# Patient Record
Sex: Female | Born: 1955 | Race: Black or African American | Hispanic: No | Marital: Single | Smoking: Former smoker
Health system: Southern US, Community
[De-identification: ages and names within clinical notes are randomized; demographics above are authoritative.]

## PROBLEM LIST (undated history)

## (undated) ENCOUNTER — Ambulatory Visit: Admission: EM

## (undated) ENCOUNTER — Emergency Department (HOSPITAL_COMMUNITY): Admission: EM | Discharge status: Absent without leave | Payer: No Typology Code available for payment source

## (undated) DIAGNOSIS — K219 Gastro-esophageal reflux disease without esophagitis: Secondary | ICD-10-CM

## (undated) DIAGNOSIS — E785 Hyperlipidemia, unspecified: Secondary | ICD-10-CM

---

## 1999-04-20 ENCOUNTER — Emergency Department (HOSPITAL_COMMUNITY): Admission: EM | Admit: 1999-04-20 | Discharge: 1999-04-20 | Payer: Self-pay | Admitting: Emergency Medicine

## 1999-04-20 ENCOUNTER — Encounter: Payer: Self-pay | Admitting: Emergency Medicine

## 1999-05-17 ENCOUNTER — Encounter (INDEPENDENT_AMBULATORY_CARE_PROVIDER_SITE_OTHER): Payer: Self-pay | Admitting: *Deleted

## 1999-05-17 LAB — CONVERTED CEMR LAB

## 1999-05-22 ENCOUNTER — Other Ambulatory Visit: Admission: RE | Admit: 1999-05-22 | Discharge: 1999-05-22 | Payer: Self-pay | Admitting: *Deleted

## 1999-05-22 ENCOUNTER — Encounter: Admission: RE | Admit: 1999-05-22 | Discharge: 1999-05-22 | Payer: Self-pay | Admitting: Family Medicine

## 1999-06-15 ENCOUNTER — Encounter: Admission: RE | Admit: 1999-06-15 | Discharge: 1999-06-15 | Payer: Self-pay | Admitting: Family Medicine

## 1999-06-30 ENCOUNTER — Encounter: Admission: RE | Admit: 1999-06-30 | Discharge: 1999-06-30 | Payer: Self-pay | Admitting: Sports Medicine

## 1999-11-09 ENCOUNTER — Encounter: Admission: RE | Admit: 1999-11-09 | Discharge: 1999-11-09 | Payer: Self-pay | Admitting: Family Medicine

## 1999-11-12 ENCOUNTER — Encounter: Admission: RE | Admit: 1999-11-12 | Discharge: 1999-11-12 | Payer: Self-pay | Admitting: Family Medicine

## 1999-12-11 ENCOUNTER — Encounter: Admission: RE | Admit: 1999-12-11 | Discharge: 1999-12-11 | Payer: Self-pay | Admitting: Family Medicine

## 1999-12-21 ENCOUNTER — Emergency Department (HOSPITAL_COMMUNITY): Admission: EM | Admit: 1999-12-21 | Discharge: 1999-12-22 | Payer: Self-pay | Admitting: Emergency Medicine

## 1999-12-23 ENCOUNTER — Encounter: Admission: RE | Admit: 1999-12-23 | Discharge: 1999-12-23 | Payer: Self-pay | Admitting: Family Medicine

## 2000-01-16 ENCOUNTER — Encounter: Payer: Self-pay | Admitting: Emergency Medicine

## 2000-01-16 ENCOUNTER — Emergency Department (HOSPITAL_COMMUNITY): Admission: EM | Admit: 2000-01-16 | Discharge: 2000-01-16 | Payer: Self-pay | Admitting: Emergency Medicine

## 2000-01-20 ENCOUNTER — Emergency Department (HOSPITAL_COMMUNITY): Admission: EM | Admit: 2000-01-20 | Discharge: 2000-01-20 | Payer: Self-pay | Admitting: Emergency Medicine

## 2000-01-20 ENCOUNTER — Encounter: Payer: Self-pay | Admitting: Emergency Medicine

## 2000-01-25 ENCOUNTER — Encounter: Admission: RE | Admit: 2000-01-25 | Discharge: 2000-01-25 | Payer: Self-pay | Admitting: Family Medicine

## 2000-03-10 ENCOUNTER — Ambulatory Visit (HOSPITAL_COMMUNITY): Admission: RE | Admit: 2000-03-10 | Discharge: 2000-03-10 | Payer: Self-pay | Admitting: Orthopedic Surgery

## 2000-03-10 ENCOUNTER — Encounter: Payer: Self-pay | Admitting: Orthopedic Surgery

## 2001-04-10 ENCOUNTER — Emergency Department (HOSPITAL_COMMUNITY): Admission: EM | Admit: 2001-04-10 | Discharge: 2001-04-10 | Payer: Self-pay

## 2003-08-29 ENCOUNTER — Emergency Department (HOSPITAL_COMMUNITY): Admission: EM | Admit: 2003-08-29 | Discharge: 2003-08-29 | Payer: Self-pay | Admitting: Emergency Medicine

## 2004-02-03 ENCOUNTER — Emergency Department (HOSPITAL_COMMUNITY): Admission: EM | Admit: 2004-02-03 | Discharge: 2004-02-03 | Payer: Self-pay | Admitting: Emergency Medicine

## 2004-09-23 ENCOUNTER — Emergency Department (HOSPITAL_COMMUNITY): Admission: EM | Admit: 2004-09-23 | Discharge: 2004-09-23 | Payer: Self-pay | Admitting: Emergency Medicine

## 2006-04-15 ENCOUNTER — Encounter (INDEPENDENT_AMBULATORY_CARE_PROVIDER_SITE_OTHER): Payer: Self-pay | Admitting: *Deleted

## 2006-08-06 IMAGING — CR DG HAND COMPLETE 3+V*R*
3 series · 3 of 3 positions shown · non-contrast
Comparison: none

CLINICAL DATA: Pain in right thumb with swelling.
 RIGHT HAND-COMPLETE:
 Three views of the right hand reveal slight degenerative changes without evidence of fracture, dislocation or other acute bony abnormality.  There is suggestion of some soft tissue swelling of the hypothenar area without evidence of foreign body.

[view not recorded (1 of 3)]
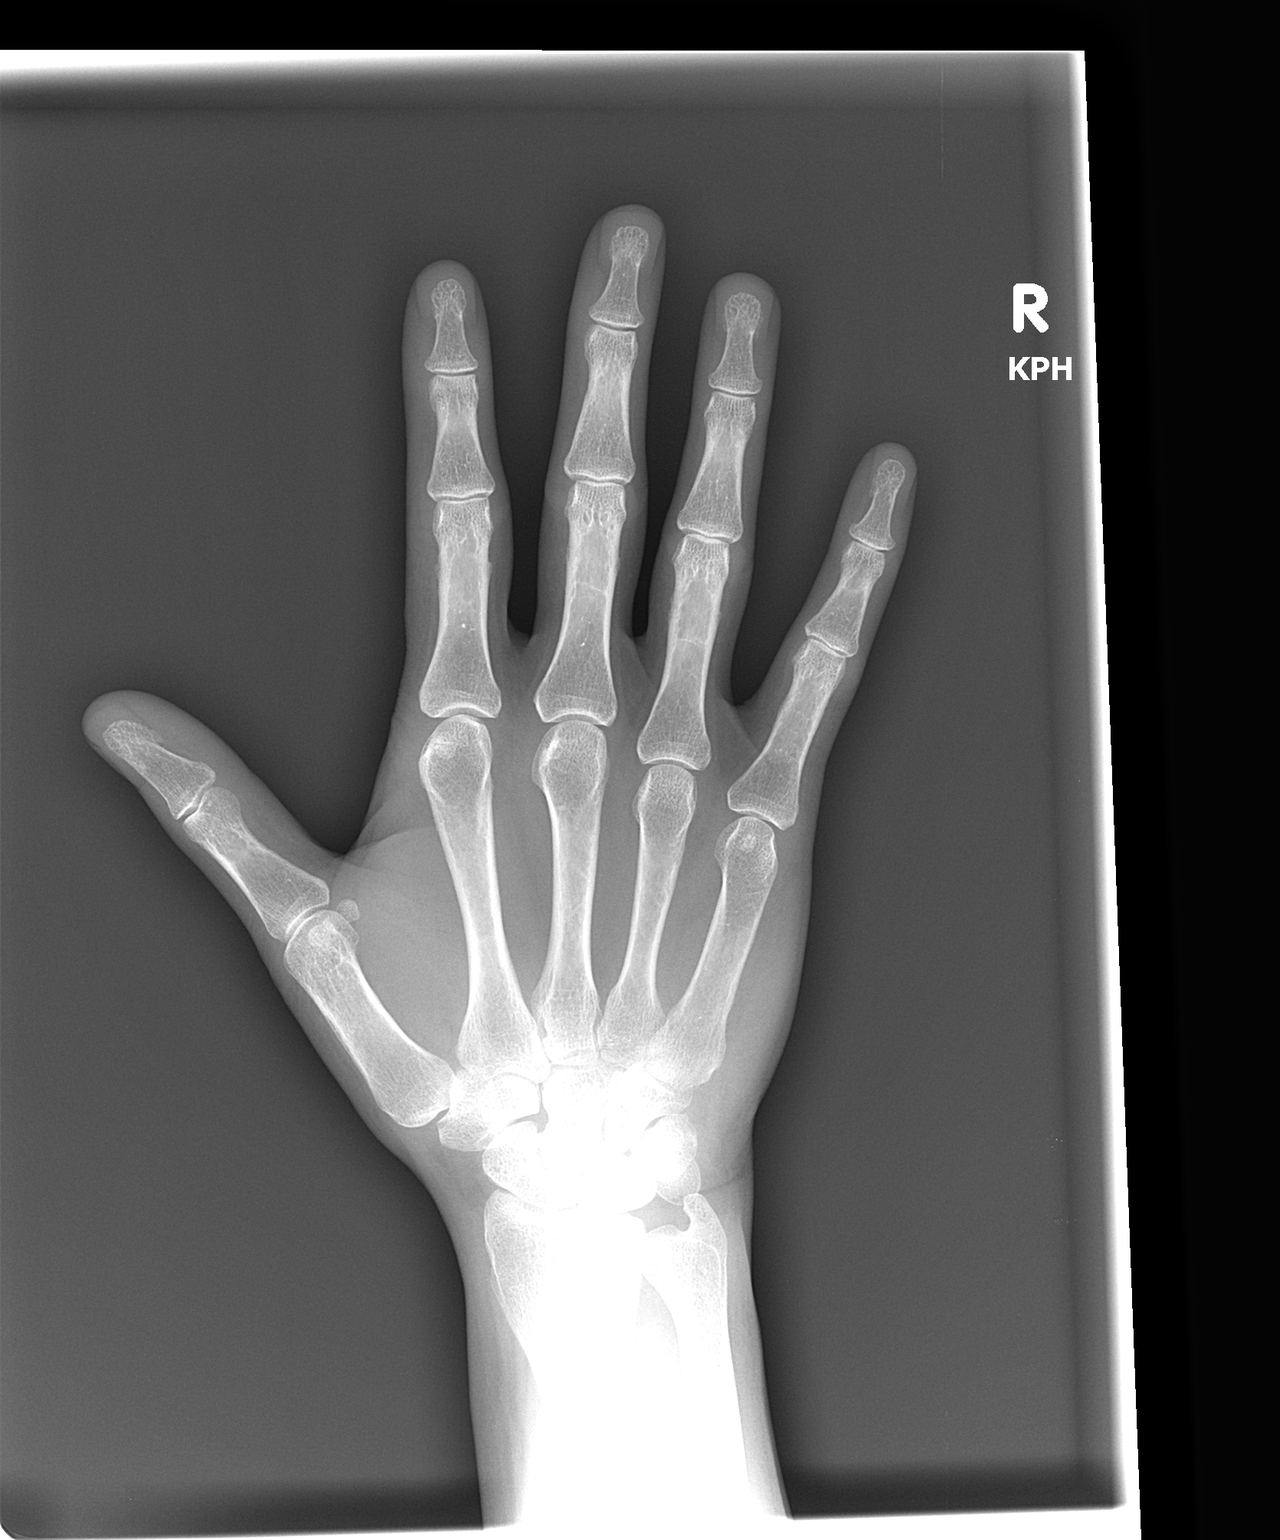

[view not recorded (2 of 3)]
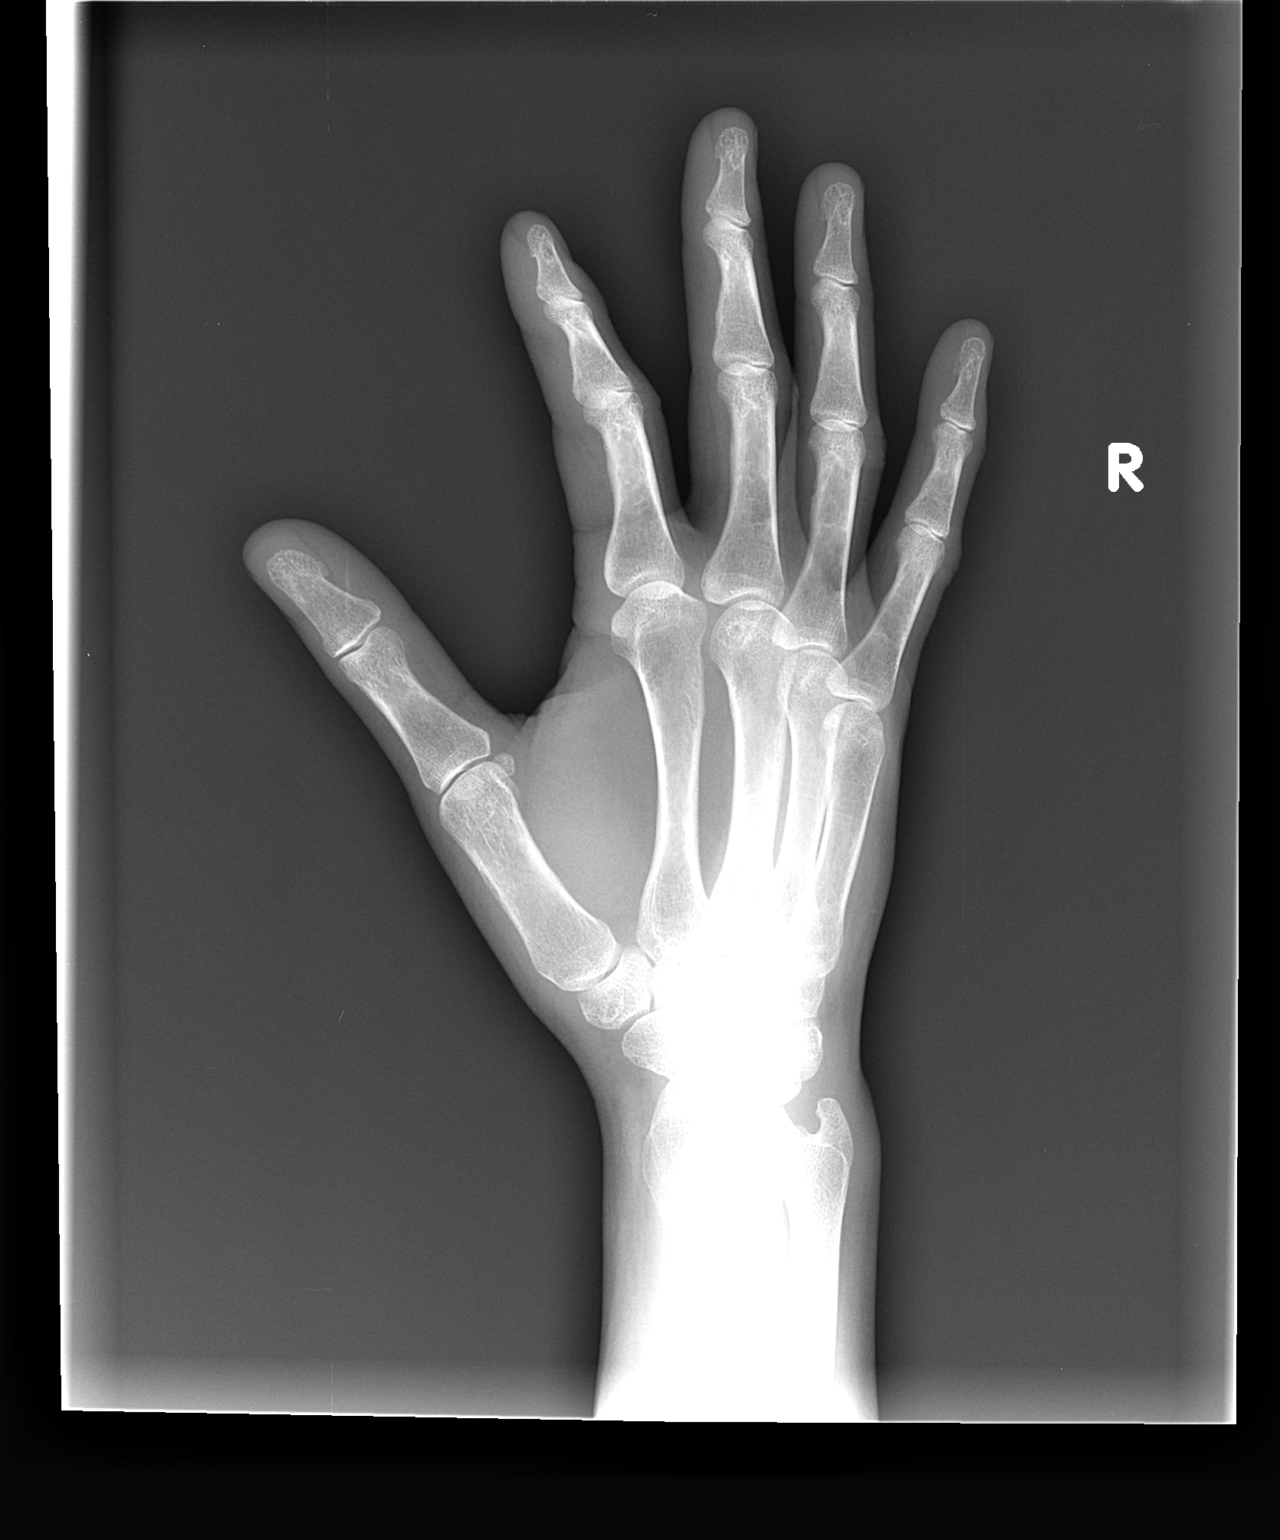

[view not recorded (3 of 3)]
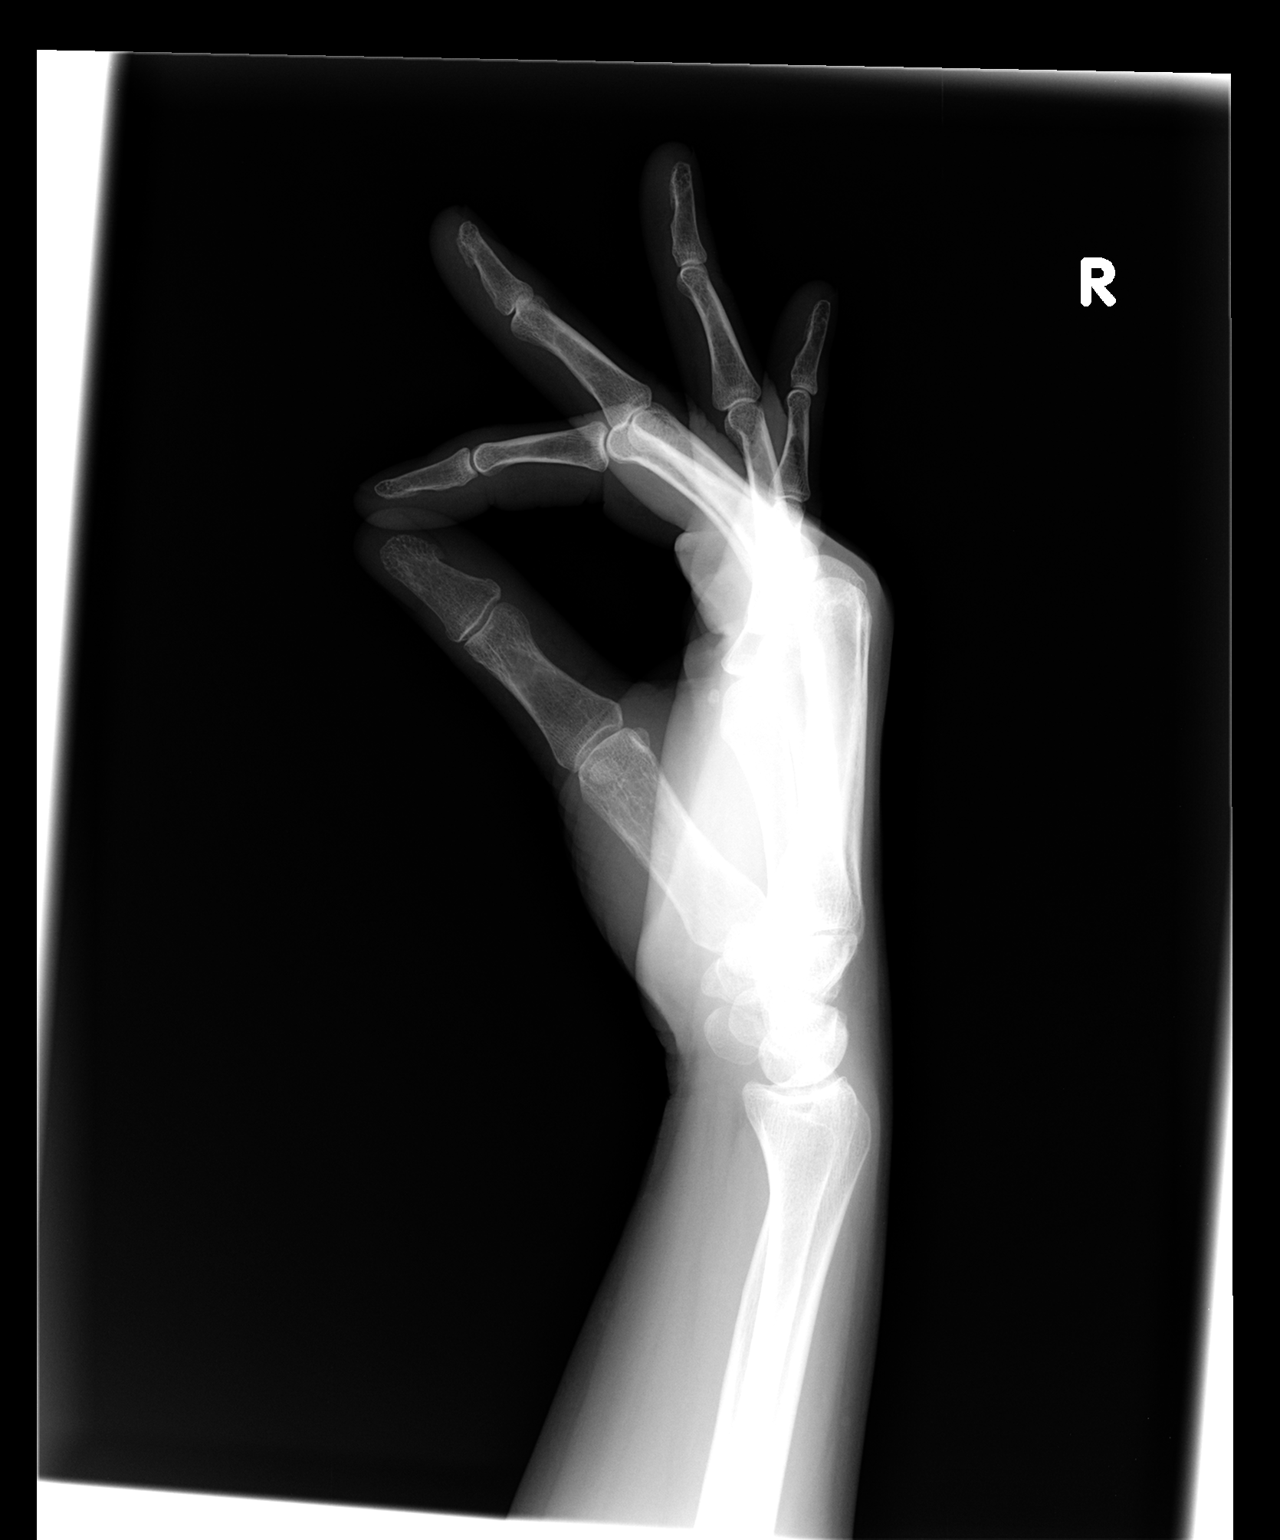

[3 of 3 positions shown; findings below may reference images not displayed]

IMPRESSION: Slight degenerative changes are noted without acute bony abnormality. Question of soft tissue swelling in the hypothenar area is suggested.

## 2006-12-04 ENCOUNTER — Other Ambulatory Visit: Payer: Self-pay | Admitting: Emergency Medicine

## 2006-12-05 ENCOUNTER — Observation Stay (HOSPITAL_COMMUNITY): Admission: AD | Admit: 2006-12-05 | Discharge: 2006-12-06 | Payer: Self-pay | Admitting: Obstetrics and Gynecology

## 2006-12-05 ENCOUNTER — Other Ambulatory Visit: Payer: Self-pay | Admitting: Emergency Medicine

## 2007-12-08 ENCOUNTER — Emergency Department (HOSPITAL_COMMUNITY): Admission: EM | Admit: 2007-12-08 | Discharge: 2007-12-08 | Payer: Self-pay | Admitting: Emergency Medicine

## 2007-12-17 ENCOUNTER — Emergency Department (HOSPITAL_COMMUNITY): Admission: EM | Admit: 2007-12-17 | Discharge: 2007-12-17 | Payer: Self-pay | Admitting: Emergency Medicine

## 2010-06-30 NOTE — Op Note (Signed)
NAMECLYDIA, Mindy Stevens NO.:  [id number]   MEDICAL RECORD NO.:  [id number]          PATIENT TYPE:  EMS   LOCATION:  MAJO                         FACILITY:  [facility]   PHYSICIAN:  Osborn Coho, M.D.   DATE OF BIRTH:  1955/11/16   DATE OF PROCEDURE:  12/05/2006  DATE OF DISCHARGE:                               OPERATIVE REPORT   PREOP DIAGNOSIS:  Left labial abscess.   POSTPROCEDURE DIAGNOSIS:  Left labial abscess.   PROCEDURE:  Incision and drainage of left labial abscess.   ANESTHESIA:  Local using approximately 30 mL of 2% lidocaine.   EBL:  Minimal.   COMPLICATIONS:  None.   PROCEDURE:  The patient was prepped with Betadine while at the bedside  in Room 21 in [facility] ER.  This was done after the patient was  consented for I&D.  The risks, benefits and alternatives of an I&D were  discussed and consent signed and witnessed.  Once the area was prepped  with Betadine over the area that was already coming to a head, the left  labia was injected with a total of 30 mL of 2% lidocaine in stages while  the area that was coming to the head was incised with an 11 blade  scalpel.  The area drained a little bit and culture was sent.  Mostly  there was generalized edema, however, and no obvious loculations were  noted.  A Tresa Endo was used to help break up some of the induration to form  a track for proper drainage.  The patient tolerated the procedure well  and the recommendation is for admit to [facility] for observation,  antibiotics and sitz baths with epsom salts.      Osborn Coho, M.D.  Electronically Signed     AR/MEDQ  D:  12/05/2006  T:  12/05/2006  Job:  [id number]

## 2010-07-03 NOTE — Discharge Summary (Signed)
NAMECARYNN, FELLING NO.:  [id number]   MEDICAL RECORD NO.:  [id number]          PATIENT TYPE:  OBV   LOCATION:  9399                          FACILITY:  [facility]   PHYSICIAN:  Osborn Coho, M.D.   DATE OF BIRTH:  [birth year]   DATE OF ADMISSION:  12/05/2006  DATE OF DISCHARGE:  12/06/2006                               DISCHARGE SUMMARY   DISCHARGE DIAGNOSIS:  Left labial abscess.   PROCEDURE:  On the date of admission the patient underwent incision and  drainage of a left labial abscess at [facility] emergency department  room #2, tolerating the procedure well.   HISTORY OF PRESENT ILLNESS:  Ms. Kimbler is a 55 year old female, para 3-0-  0-3, who presented to [facility] emergency department with a fever of  102 degrees Fahrenheit orally, complaining of a 3-day history of a left  labial swelling.  Please see the patient's dictated history and physical  examination for details.   ADMISSION PHYSICAL EXAM:  Temperature 102.9 degrees Fahrenheit orally.  LUNGS:  Clear.  HEART:  Regular rate and rhythm  External genitalia revealed left labial erythema, edema and induration,  which was exquisitely tender and was pointing.   HOSPITAL COURSE:  On the day of admission the patient underwent incision  and drainage of a left labial abscess in the emergency department of  [facility], room #21.  Postoperative course was unremarkable with the  patient's temperature normalizing by postop day #1, her pain managed by  oral analgesia, and was therefore deemed ready for discharge home.  The  patient's HIV serology, hepatitis B and blood cultures were all negative  at time of discharge.   DISCHARGE MEDICATIONS:  1. Motrin 800 mg one tablet every 8 hours with food as needed for mild      to moderate pain.  2. Darvocet-N 100 one tablet every 4-6 hours as needed for severe      pain.  3. Augmentin 875/125 mg one tablet twice daily for 13 days.   FOLLOW-UP:  The patient is to call  [facility] at [phone]  to schedule a follow-up appointment on December 09, 2006.   DISCHARGE INSTRUCTIONS:  The patient was advised to perform hot sitz  baths twice daily, to keep her incision and drainage area clean and dry,  to avoid sexual intercourse for 2 weeks, that she may walk up steps,  should increase her activity slowly, that she may shower and bathe.  Her  diet was without restriction.      [place]mira J. Adline Peals.      Osborn Coho, M.D.  Electronically Signed    EJP/MEDQ  D:  01/03/2007  T:  01/03/2007  Job:  [id number]

## 2010-11-25 LAB — DIFFERENTIAL
Basophils Absolute: 0.1
Basophils Absolute: 0.1
Basophils Relative: 0
Basophils Relative: 1
Eosinophils Absolute: 0
Eosinophils Absolute: 0
Eosinophils Relative: 0
Eosinophils Relative: 0
Lymphocytes Relative: 13
Lymphocytes Relative: 21
Lymphs Abs: 2.4
Lymphs Abs: 3
Monocytes Absolute: 1.3 — ABNORMAL HIGH
Monocytes Absolute: 1.3 — ABNORMAL HIGH
Monocytes Relative: 7
Monocytes Relative: 9
Neutro Abs: 14.2 — ABNORMAL HIGH
Neutro Abs: 9.8 — ABNORMAL HIGH
Neutrophils Relative %: 69
Neutrophils Relative %: 79 — ABNORMAL HIGH

## 2010-11-25 LAB — CBC
HCT: 33.9 — ABNORMAL LOW
HCT: 39.4
Hemoglobin: 11.4 — ABNORMAL LOW
Hemoglobin: 13.1
MCHC: 33.4
MCHC: 33.7
MCV: 92.1
MCV: 92.6
Platelets: 167
Platelets: 210
RBC: 3.66 — ABNORMAL LOW
RBC: 4.28
RDW: 13
RDW: 13.1
WBC: 14.2 — ABNORMAL HIGH
WBC: 18 — ABNORMAL HIGH

## 2010-11-25 LAB — CULTURE, BLOOD (ROUTINE X 2)
Culture: NO GROWTH
Culture: NO GROWTH

## 2010-11-25 LAB — HIV ANTIBODY (ROUTINE TESTING W REFLEX): HIV: NONREACTIVE

## 2010-11-25 LAB — CULTURE, ROUTINE-ABSCESS: Gram Stain: NONE SEEN

## 2010-11-25 LAB — RPR: RPR Ser Ql: NONREACTIVE

## 2010-11-25 LAB — HEPATITIS B SURFACE ANTIGEN: Hepatitis B Surface Ag: NEGATIVE

## 2010-11-25 LAB — GLUCOSE, RANDOM: Glucose, Bld: 105 — ABNORMAL HIGH

## 2013-12-16 ENCOUNTER — Emergency Department (HOSPITAL_COMMUNITY): Payer: Self-pay

## 2013-12-16 ENCOUNTER — Emergency Department (HOSPITAL_COMMUNITY)
Admission: EM | Admit: 2013-12-16 | Discharge: 2013-12-16 | Disposition: A | Discharge status: Home / Self Care | Payer: Self-pay | Attending: Emergency Medicine | Admitting: Emergency Medicine

## 2013-12-16 ENCOUNTER — Encounter (HOSPITAL_COMMUNITY): Payer: Self-pay | Admitting: Family Medicine

## 2013-12-16 DIAGNOSIS — Z87891 Personal history of nicotine dependence: Secondary | ICD-10-CM | POA: Insufficient documentation

## 2013-12-16 DIAGNOSIS — K047 Periapical abscess without sinus: Secondary | ICD-10-CM | POA: Insufficient documentation

## 2013-12-16 DIAGNOSIS — B349 Viral infection, unspecified: Secondary | ICD-10-CM | POA: Insufficient documentation

## 2013-12-16 DIAGNOSIS — R059 Cough, unspecified: Secondary | ICD-10-CM

## 2013-12-16 DIAGNOSIS — R05 Cough: Secondary | ICD-10-CM

## 2013-12-16 DIAGNOSIS — R112 Nausea with vomiting, unspecified: Secondary | ICD-10-CM

## 2013-12-16 DIAGNOSIS — R509 Fever, unspecified: Secondary | ICD-10-CM

## 2013-12-16 LAB — CBC WITH DIFFERENTIAL/PLATELET
BASOS PCT: 0 % (ref 0–1)
Basophils Absolute: 0 10*3/uL (ref 0.0–0.1)
Eosinophils Absolute: 0 10*3/uL (ref 0.0–0.7)
Eosinophils Relative: 0 % (ref 0–5)
HCT: 40.3 % (ref 36.0–46.0)
Hemoglobin: 12.9 g/dL (ref 12.0–15.0)
LYMPHS PCT: 22 % (ref 12–46)
Lymphs Abs: 1.5 10*3/uL (ref 0.7–4.0)
MCH: 29.3 pg (ref 26.0–34.0)
MCHC: 32 g/dL (ref 30.0–36.0)
MCV: 91.4 fL (ref 78.0–100.0)
Monocytes Absolute: 1.1 10*3/uL — ABNORMAL HIGH (ref 0.1–1.0)
Monocytes Relative: 16 % — ABNORMAL HIGH (ref 3–12)
NEUTROS ABS: 4.3 10*3/uL (ref 1.7–7.7)
NEUTROS PCT: 62 % (ref 43–77)
Platelets: 190 10*3/uL (ref 150–400)
RBC: 4.41 MIL/uL (ref 3.87–5.11)
RDW: 13 % (ref 11.5–15.5)
WBC: 6.9 10*3/uL (ref 4.0–10.5)

## 2013-12-16 LAB — COMPREHENSIVE METABOLIC PANEL
ALBUMIN: 4.3 g/dL (ref 3.5–5.2)
ALT: 38 U/L — ABNORMAL HIGH (ref 0–35)
AST: 31 U/L (ref 0–37)
Alkaline Phosphatase: 111 U/L (ref 39–117)
Anion gap: 16 — ABNORMAL HIGH (ref 5–15)
BUN: 10 mg/dL (ref 6–23)
CHLORIDE: 95 meq/L — AB (ref 96–112)
CO2: 27 meq/L (ref 19–32)
CREATININE: 0.89 mg/dL (ref 0.50–1.10)
Calcium: 10.2 mg/dL (ref 8.4–10.5)
GFR calc Af Amer: 82 mL/min — ABNORMAL LOW (ref >90)
GFR calc non Af Amer: 71 mL/min — ABNORMAL LOW (ref >90)
Glucose, Bld: 116 mg/dL — ABNORMAL HIGH (ref 70–99)
POTASSIUM: 4 meq/L (ref 3.7–5.3)
Sodium: 138 mEq/L (ref 137–147)
Total Bilirubin: 0.7 mg/dL (ref 0.3–1.2)
Total Protein: 8.5 g/dL — ABNORMAL HIGH (ref 6.0–8.3)

## 2013-12-16 MED ORDER — ALBUTEROL SULFATE HFA 108 (90 BASE) MCG/ACT IN AERS
6.0000 | INHALATION_SPRAY | Freq: Once | RESPIRATORY_TRACT | Status: AC
  Administered 2013-12-16: 6 via RESPIRATORY_TRACT
  Filled 2013-12-16: qty 6.7

## 2013-12-16 MED ORDER — PENICILLIN V POTASSIUM 250 MG PO TABS: 250.0000 mg | ORAL_TABLET | Freq: Four times a day (QID) | ORAL | Status: AC

## 2013-12-16 NOTE — ED Notes (Signed)
Gave pt ginger ale with MD Modesto CharonWong approval.

## 2013-12-16 NOTE — ED Notes (Signed)
Per pt sts she has been having vomiting, chills, ha and dizziness. sts also abscess to mouth. sts x 5 days.

## 2013-12-16 NOTE — Discharge Instructions (Signed)
1. Penicillin for 10 days 2. Motrin 600 mg with food three times daily for 2-3 days and then as needed every 6 hours 3. See a dentist to get your teeth pulled 4. Motrin for fever, OTC cough suppressants and treatments 5. Come back if worsening symptoms Abscessed Tooth An abscessed tooth is an infection around your tooth. It may be caused by holes or damage to the tooth (cavity) or a dental disease. An abscessed tooth causes mild to very bad pain in and around the tooth. See your dentist right away if you have tooth or gum pain. HOME CARE  Take your medicine as told. Finish it even if you start to feel better.  Do not drive after taking pain medicine.  Rinse your mouth (gargle) often with salt water ( teaspoon salt in 8 ounces of warm water).  Do not apply heat to the outside of your face. GET HELP RIGHT AWAY IF:   You have a temperature by mouth above 102 F (38.9 C), not controlled by medicine.  You have chills and a very bad headache.  You have problems breathing or swallowing.  Your mouth will not open.  You develop puffiness (swelling) on the neck or around the eye.  Your pain is not helped by medicine.  Your pain is getting worse instead of better. MAKE SURE YOU:   Understand these instructions.  Will watch your condition.  Will get help right away if you are not doing well or get worse. Document Released: 07/21/2007 Document Revised: 04/26/2011 Document Reviewed: 05/12/2010 [facility] Patient Information 2015 [place]. This information is not intended to replace advice given to you by your health care provider. Make sure you discuss any questions you have with your health care provider.

## 2013-12-16 NOTE — ED Provider Notes (Signed)
CSN: [id number]     Arrival date & time 12/16/13  1613 History   First MD Initiated Contact with Patient 12/16/13 1941     Chief Complaint  Patient presents with  . Headache  . Chills  . Emesis     (Consider location/radiation/quality/duration/timing/severity/associated sxs/prior Treatment) Patient is a 58 y.o. female presenting with URI. The history is provided by the patient. No language interpreter was used.  URI Presenting symptoms: congestion, cough, fever and rhinorrhea   Presenting symptoms: no sore throat   Congestion:    Location:  Nasal   Interferes with sleep: no     Interferes with eating/drinking: no   Cough:    Cough characteristics:  Productive   Sputum characteristics:  Yellow   Severity:  Moderate   Onset quality:  Gradual   Duration:  1 week   Timing:  Constant   Progression:  Unchanged   Chronicity:  New Fever:    Timing:  Intermittent   Max temp PTA (F):  101   Temp source:  Oral   Progression:  Partially resolved Rhinorrhea:    Quality:  Clear   Severity:  Moderate   Duration:  1 week   Timing:  Constant   Progression:  Unchanged Severity:  Moderate Onset quality:  Gradual Duration:  1 week Timing:  Constant Progression:  Unchanged Chronicity:  New Relieved by:  Nothing Worsened by:  Breathing Ineffective treatments:  None tried Associated symptoms: headaches and myalgias   Associated symptoms: no sinus pain and no swollen glands   Risk factors: sick contacts   Risk factors: no diabetes mellitus, no immunosuppression and no recent illness     History reviewed. No pertinent past medical history. History reviewed. No pertinent past surgical history. History reviewed. No pertinent family history. History  Substance Use Topics  . Smoking status: Former Games developermoker  . Smokeless tobacco: Not on file  . Alcohol Use: No   OB History    No data available     Review of Systems  Constitutional: Positive for fever.  HENT: Positive for  congestion and rhinorrhea. Negative for sore throat.   Respiratory: Positive for cough and shortness of breath.   Cardiovascular: Negative for chest pain.  Gastrointestinal: Negative for nausea, vomiting, abdominal pain and diarrhea.  Genitourinary: Negative for dysuria and hematuria.  Musculoskeletal: Positive for myalgias.  Skin: Negative for rash.  Neurological: Positive for light-headedness and headaches. Negative for syncope.  All other systems reviewed and are negative.     Allergies  Review of patient's allergies indicates no known allergies.  Home Medications   Prior to Admission medications   Not on File   BP 152/51 mmHg  Pulse 75  Temp(Src) 98.2 F (36.8 C) (Oral)  Resp 18  SpO2 94% Physical Exam  Constitutional: She is oriented to person, place, and time. She appears well-developed and well-nourished.  HENT:  Head: Normocephalic and atraumatic.  Right Ear: External ear normal.  Left Ear: External ear normal.  No evidence of ludwigs, tenderness to palpation, 2nd molar on left lower mouth. Evidence of dental caries and poor dentition  Eyes: EOM are normal.  Neck: Normal range of motion. Neck supple.  Cardiovascular: Normal rate, regular rhythm and intact distal pulses.  Exam reveals no gallop and no friction rub.   No murmur heard. Pulmonary/Chest: Effort normal and breath sounds normal. No respiratory distress. She has no wheezes. She has no rales. She exhibits no tenderness.  Abdominal: Soft. Bowel sounds are normal. She exhibits no  distension. There is no tenderness. There is no rebound.  Musculoskeletal: Normal range of motion. She exhibits no edema or tenderness.  Lymphadenopathy:    She has cervical adenopathy (shotty).  Neurological: She is alert and oriented to person, place, and time.  Skin: Skin is warm. No rash noted.  Psychiatric: She has a normal mood and affect. Her behavior is normal.  Nursing note and vitals reviewed.   ED Course  Procedures  (including critical care time) Labs Review Labs Reviewed  CBC WITH DIFFERENTIAL - Abnormal; Notable for the following:    Monocytes Relative 16 (*)    Monocytes Absolute 1.1 (*)    All other components within normal limits  COMPREHENSIVE METABOLIC PANEL - Abnormal; Notable for the following:    Chloride 95 (*)    Glucose, Bld 116 (*)    Total Protein 8.5 (*)    ALT 38 (*)    GFR calc non Af Amer 71 (*)    GFR calc Af Amer 82 (*)    Anion gap 16 (*)    All other components within normal limits    Imaging Review Dg Chest 2 View  12/16/2013   CLINICAL DATA:  Cough. Congestion. Fever. Dizziness. Initial encounter.  EXAM: CHEST  2 VIEW  COMPARISON:  09/23/2004.  FINDINGS: The exam is mildly under penetrated. The cardiopericardial silhouette and mediastinal contours are within normal limits. Chronic bronchitic changes are present. No airspace consolidation. No effusion.  IMPRESSION: No acute cardiopulmonary disease. Chronic appearing bronchitic changes.   Electronically Signed   By: Andreas Newport M.D.   On: 12/16/2013 17:45     EKG Interpretation None      MDM   Final diagnoses:  Cough    7:43 PM Pt is a 58 y.o. female with no pertinent PMHX who presents to the ED with URI symptoms, 1 episode of non bloody non bilious emesis, myalgias, lightheadedness and concern for abscess to left lower tooth. Patient has had URI symptoms for the past week. Fever yesterday of 101 orally. Sick contacts. No flu shot. Endorses 1 episode of nausea and vomiting non bilious non bloody. No diarrhea. Endorses congestion and rhinorrhea. Cough productive of yellow sputum. No history of asthma or DM or immunosuppression.   Patient concerned she has the flu. Not in the window for tamiflu: not in high risk group. Likely viral illness. Will hold off on testing. No ETOH abuse. Low suspicion for pancreatitis. On exam: has likely abscess to left lower 2nd molar. No evidence of ludwig's on exam.  Plan for CXr:  given fever and productive sputum. Will also obtain screening labs given vomiting  Review of labs: CBC: no leukocytosis, H&H 12.9/40.3 CMP: hypochloremia, elevated ALT  CXR PA/LAt per my read for cough no evidence of pneumonia  Patient with viral illness, abscess to left lower tooth given poor dentition. Plan for discharge with script for PCN VK and scheduled NSAIDs. Patient to follow up with dentist. Strict return precautions given  10:22 PM: I have discussed the diagnosis/risks/treatment options with the patient and believe the pt to be eligible for discharge home to follow-up with [facility]. We also discussed returning to the ED immediately if new or worsening sx occur. We discussed the sx which are most concerning (e.g., worsening symptoms) that necessitate immediate return. Any new prescriptions provided to the patient are listed below.   New Prescriptions   No medications on file    The patient appears reasonably screened and/or stabilized for discharge  and I doubt any other medical condition or other [facility] requiring further screening, evaluation or treatment in the ED at this time prior to discharge . Pt in agreement with discharge plan. Return precautions given. Pt discharged VSS   Labs and imaging reviewed by myself and considered in medical decision making if ordered.  Imaging interpreted by radiology. Pt was discussed with my attending, Dr. Lowella PettiesWofford     Payton Moder Peter Rosevelt Luu, MD 12/17/13 0020

## 2013-12-16 NOTE — ED Notes (Signed)
Pt ambulated to bathroom 

## 2013-12-17 NOTE — ED Provider Notes (Signed)
I saw and evaluated the patient, reviewed the resident's note and I agree with the findings and plan.   EKG Interpretation None      58 yo female with a week of URI symptoms.  On exam, well appearing, nontoxic, not distressed, normal respiratory effort, normal perfusion, lungs with mild diffuse wheezing, oropharynx clear without signs of PTA or deep space infection, sinuses nontender to palpation. DC with albuterol for cough.  Otherwise, supportive treatment.  Clinical Impression: URI    Warnell Foresterrey Alphonza Tramell, MD 12/17/13 1905

## 2014-04-05 ENCOUNTER — Other Ambulatory Visit (HOSPITAL_COMMUNITY): Payer: Self-pay | Admitting: Internal Medicine

## 2014-04-05 ENCOUNTER — Ambulatory Visit (HOSPITAL_COMMUNITY)
Admission: RE | Admit: 2014-04-05 | Discharge: 2014-04-05 | Disposition: A | Discharge status: Home / Self Care | Payer: No Typology Code available for payment source | Source: Ambulatory Visit | Attending: Internal Medicine | Admitting: Internal Medicine

## 2014-04-05 DIAGNOSIS — R0789 Other chest pain: Secondary | ICD-10-CM

## 2014-04-05 DIAGNOSIS — R05 Cough: Secondary | ICD-10-CM | POA: Insufficient documentation

## 2014-04-05 DIAGNOSIS — R079 Chest pain, unspecified: Secondary | ICD-10-CM | POA: Insufficient documentation

## 2014-06-13 ENCOUNTER — Ambulatory Visit (HOSPITAL_COMMUNITY)
Admission: RE | Admit: 2014-06-13 | Discharge: 2014-06-13 | Disposition: A | Discharge status: Home / Self Care | Payer: No Typology Code available for payment source | Source: Ambulatory Visit | Attending: Internal Medicine | Admitting: Internal Medicine

## 2014-06-13 ENCOUNTER — Other Ambulatory Visit (HOSPITAL_COMMUNITY): Payer: Self-pay | Admitting: Internal Medicine

## 2014-06-13 DIAGNOSIS — M85812 Other specified disorders of bone density and structure, left shoulder: Secondary | ICD-10-CM | POA: Insufficient documentation

## 2014-06-13 DIAGNOSIS — R52 Pain, unspecified: Secondary | ICD-10-CM

## 2014-06-13 DIAGNOSIS — M7532 Calcific tendinitis of left shoulder: Secondary | ICD-10-CM | POA: Insufficient documentation

## 2015-08-23 ENCOUNTER — Encounter (HOSPITAL_COMMUNITY): Payer: Self-pay | Admitting: *Deleted

## 2015-08-23 ENCOUNTER — Emergency Department (HOSPITAL_COMMUNITY)
Admission: EM | Admit: 2015-08-23 | Discharge: 2015-08-23 | Disposition: A | Discharge status: Home / Self Care | Payer: Self-pay | Attending: Emergency Medicine | Admitting: Emergency Medicine

## 2015-08-23 ENCOUNTER — Emergency Department (HOSPITAL_COMMUNITY): Payer: Self-pay

## 2015-08-23 DIAGNOSIS — R0602 Shortness of breath: Secondary | ICD-10-CM | POA: Insufficient documentation

## 2015-08-23 DIAGNOSIS — R072 Precordial pain: Secondary | ICD-10-CM | POA: Insufficient documentation

## 2015-08-23 DIAGNOSIS — R079 Chest pain, unspecified: Secondary | ICD-10-CM

## 2015-08-23 DIAGNOSIS — Z791 Long term (current) use of non-steroidal anti-inflammatories (NSAID): Secondary | ICD-10-CM | POA: Insufficient documentation

## 2015-08-23 DIAGNOSIS — Z79891 Long term (current) use of opiate analgesic: Secondary | ICD-10-CM | POA: Insufficient documentation

## 2015-08-23 LAB — BASIC METABOLIC PANEL
Anion gap: 6 (ref 5–15)
BUN: 15 mg/dL (ref 6–20)
CHLORIDE: 103 mmol/L (ref 101–111)
CO2: 29 mmol/L (ref 22–32)
CREATININE: 0.81 mg/dL (ref 0.44–1.00)
Calcium: 9.4 mg/dL (ref 8.9–10.3)
GFR calc Af Amer: >60 mL/min (ref >60)
GFR calc non Af Amer: >60 mL/min (ref >60)
GLUCOSE: 101 mg/dL — AB (ref 65–99)
Potassium: 3.5 mmol/L (ref 3.5–5.1)
SODIUM: 138 mmol/L (ref 135–145)

## 2015-08-23 LAB — HEPATIC FUNCTION PANEL
ALK PHOS: 56 U/L (ref 38–126)
ALT: 22 U/L (ref 14–54)
AST: 20 U/L (ref 15–41)
Albumin: 4.5 g/dL (ref 3.5–5.0)
BILIRUBIN DIRECT: 0.1 mg/dL (ref 0.1–0.5)
BILIRUBIN INDIRECT: 0.7 mg/dL (ref 0.3–0.9)
BILIRUBIN TOTAL: 0.8 mg/dL (ref 0.3–1.2)
Total Protein: 8 g/dL (ref 6.5–8.1)

## 2015-08-23 LAB — URINALYSIS, ROUTINE W REFLEX MICROSCOPIC
Bilirubin Urine: NEGATIVE
GLUCOSE, UA: NEGATIVE mg/dL
Ketones, ur: NEGATIVE mg/dL
Leukocytes, UA: NEGATIVE
Nitrite: NEGATIVE
PH: 5.5 (ref 5.0–8.0)
PROTEIN: NEGATIVE mg/dL
SPECIFIC GRAVITY, URINE: 1.02 (ref 1.005–1.030)

## 2015-08-23 LAB — CBC
HCT: 41.4 % (ref 36.0–46.0)
Hemoglobin: 13 g/dL (ref 12.0–15.0)
MCH: 29.7 pg (ref 26.0–34.0)
MCHC: 31.4 g/dL (ref 30.0–36.0)
MCV: 94.5 fL (ref 78.0–100.0)
PLATELETS: 214 10*3/uL (ref 150–400)
RBC: 4.38 MIL/uL (ref 3.87–5.11)
RDW: 13.2 % (ref 11.5–15.5)
WBC: 8.2 10*3/uL (ref 4.0–10.5)

## 2015-08-23 LAB — URINE MICROSCOPIC-ADD ON: WBC, UA: NONE SEEN WBC/hpf (ref 0–5)

## 2015-08-23 LAB — I-STAT TROPONIN, ED: Troponin i, poc: 0 ng/mL (ref 0.00–0.08)

## 2015-08-23 LAB — LIPASE, BLOOD: LIPASE: 23 U/L (ref 11–51)

## 2015-08-23 MED ORDER — ASPIRIN 81 MG PO CHEW
324.0000 mg | CHEWABLE_TABLET | Freq: Once | ORAL | Status: AC
  Administered 2015-08-23: 324 mg via ORAL
  Filled 2015-08-23: qty 4

## 2015-08-23 NOTE — ED Notes (Signed)
Pt comes in with chest pain starting yesterday. She states this feels like a stabbing pain in the central part of her chest. States she has had dizziness and nausea, denies vomiting.

## 2015-08-23 NOTE — ED Provider Notes (Signed)
CSN: [id number]     Arrival date & time 08/23/15  1230 History   First MD Initiated Contact with Patient 08/23/15 1444     Chief Complaint  Patient presents with  . Chest Pain     Patient is a 60 y.o. female presenting with chest pain. The history is provided by the patient.  Chest Pain Pain location:  Substernal area Pain quality: sharp   Pain radiates to:  Does not radiate Pain radiates to the back: no   Pain severity:  Moderate Onset quality:  Gradual Timing:  Intermittent Progression:  Unchanged Chronicity:  New Relieved by:  Nothing Worsened by:  Certain positions, exertion and movement Associated symptoms: dizziness and shortness of breath   Associated symptoms: no abdominal pain, no back pain, no diaphoresis, no fever, no lower extremity edema, no syncope and not vomiting   Risk factors: high cholesterol   Risk factors: no coronary artery disease, no diabetes mellitus, no hypertension, not female, no prior DVT/PE and no smoking   Patient reports she has had CP since yesterday She reports it is on/off, last for several seconds then resolves Occurs at rest and with walking and positional She reports mild SOB No h/o CAD She reports recent camping last week (stayed in [place]) and did a lot of heavy lifting but pain did not start until a day after returning  She also reports "side pain" for 2 weeks that is unrelated to CP  PMH - hyperlipidema Soc hx - former smoker (3 yrs ago) fam hx - negative for CAD Social History  Substance Use Topics  . Smoking status: Former Games developer  . Smokeless tobacco: None  . Alcohol Use: No   OB History    No data available     Review of Systems  Constitutional: Negative for fever and diaphoresis.  Respiratory: Positive for shortness of breath.   Cardiovascular: Positive for chest pain. Negative for syncope.  Gastrointestinal: Negative for vomiting and abdominal pain.  Genitourinary: Negative for dysuria.  Musculoskeletal: Negative  for back pain.  Neurological: Positive for dizziness. Negative for syncope.  All other systems reviewed and are negative.     Allergies  Review of patient's allergies indicates no known allergies.  Home Medications   Prior to Admission medications   Medication Sig Start Date End Date Taking? Authorizing Provider  ibuprofen (ADVIL,MOTRIN) 200 MG tablet Take 200 mg by mouth every 6 (six) hours as needed.    Historical Provider, MD   BP 124/65 mmHg  Pulse 86  Temp(Src) 98.8 F (37.1 C) (Oral)  Resp 16  Ht  (1.549 m)  Wt 100.699 kg  BMI 41.97 kg/m2  SpO2 97% Physical Exam CONSTITUTIONAL: Well developed/well nourished HEAD: Normocephalic/atraumatic EYES: EOMI/PERRL ENMT: Mucous membranes moist NECK: supple no meningeal signs SPINE/BACK:entire spine nontender CV: S1/S2 noted, no murmurs/rubs/gallops noted LUNGS: Lungs are clear to auscultation bilaterally, no apparent distress Chest - central chest wall tenderness, no crepitus ABDOMEN: soft, mild RUQ tenderness, no rebound or guarding, bowel sounds noted throughout abdomen GU:no cva tenderness NEURO: Pt is awake/alert/appropriate, moves all extremitiesx4.  No facial droop.   EXTREMITIES: pulses normal/equal, full ROM SKIN: warm, color normal PSYCH: no abnormalities of mood noted, alert and oriented to situation  ED Course  Procedures  HEART score =2 Pt with reproducible CP I doubt ACS/PE at this time 5:09 PM Pt well appearing No other CP episodes She ambulated without difficulty - pulse ox 94-96% while ambulating Pt reports continued SOB, but she admits  this is a daily issue - reports she has a "pump" (albuterol) that she uses at home.  She reports the SOB is a chronic ongoing issue While in the room and I am talking to her pulse >95% on room air Denies pleuritic CP I doubt PE Repeat troponin not performed as initial trop was delayed after initial visit Will d/c home She has a PCP in [place] she will f/u in one  week We discussed strict return precautions  Labs Review Labs Reviewed  BASIC METABOLIC PANEL - Abnormal; Notable for the following:    Glucose, Bld 101 (*)    All other components within normal limits  URINALYSIS, ROUTINE W REFLEX MICROSCOPIC (NOT AT [facility]) - Abnormal; Notable for the following:    Hgb urine dipstick TRACE (*)    All other components within normal limits  URINE MICROSCOPIC-ADD ON - Abnormal; Notable for the following:    Squamous Epithelial / LPF 0-5 (*)    Bacteria, UA RARE (*)    All other components within normal limits  CBC  LIPASE, BLOOD  HEPATIC FUNCTION PANEL  I-STAT TROPOININ, ED    Imaging Review Dg Chest 2 View  08/23/2015  CLINICAL DATA:  Chest pain. Stabbing pain in central part of the chest. EXAM: CHEST  2 VIEW COMPARISON:  04/05/2014 FINDINGS: Normal mediastinum and cardiac silhouette. Normal pulmonary vasculature. No evidence of effusion, infiltrate, or pneumothorax. Mild basilar atelectasis. No acute bony abnormality. IMPRESSION: No active cardiopulmonary disease. Mild basilar atelectasis, unchanged. Electronically Signed   By: Genevive BiStewart  Edmunds M.D.   On: 08/23/2015 13:40   I have personally reviewed and evaluated these images and lab results as part of my medical decision-making.   EKG Interpretation   Date/Time:  Saturday August 23 2015 12:36:16 EDT Ventricular Rate:  83 PR Interval:  144 QRS Duration: 74 QT Interval:  368 QTC Calculation: 432 R Axis:   66 Text Interpretation:  Normal sinus rhythm Normal ECG No significant change  since last tracing Confirmed by Bebe ShaggyWICKLINE  MD, Dorinda HillNALD ([id number]) on 08/23/2015  12:48:47 PM      MDM   Final diagnoses:  Chest pain, unspecified chest pain type    Nursing notes including past medical history and social history reviewed and considered in documentation xrays/imaging reviewed by myself and considered during evaluation Labs/vital reviewed myself and considered during evaluation     Zadie Rhineonald  Egan Sahlin, MD 08/23/15 1711

## 2015-08-23 NOTE — Discharge Instructions (Signed)

## 2015-08-23 NOTE — ED Notes (Signed)
Pt Oxygen maintained mid 90's while ambulating.

## 2015-12-19 LAB — GLUCOSE, POCT (MANUAL RESULT ENTRY): POC Glucose: 114 mg/dl — AB (ref 70–99)

## 2016-12-03 ENCOUNTER — Other Ambulatory Visit (HOSPITAL_COMMUNITY): Payer: Self-pay | Admitting: *Deleted

## 2016-12-03 DIAGNOSIS — N644 Mastodynia: Secondary | ICD-10-CM

## 2016-12-08 ENCOUNTER — Other Ambulatory Visit (HOSPITAL_COMMUNITY): Payer: Self-pay | Admitting: *Deleted

## 2016-12-08 DIAGNOSIS — N644 Mastodynia: Secondary | ICD-10-CM

## 2017-01-04 ENCOUNTER — Ambulatory Visit (HOSPITAL_COMMUNITY)
Admission: RE | Admit: 2017-01-04 | Discharge: 2017-01-04 | Disposition: A | Discharge status: Home / Self Care | Payer: Self-pay | Source: Ambulatory Visit | Attending: Obstetrics and Gynecology | Admitting: Obstetrics and Gynecology

## 2017-01-04 ENCOUNTER — Encounter (HOSPITAL_COMMUNITY): Payer: Self-pay

## 2017-01-04 ENCOUNTER — Ambulatory Visit
Admission: RE | Admit: 2017-01-04 | Discharge: 2017-01-04 | Disposition: A | Discharge status: Home / Self Care | Payer: No Typology Code available for payment source | Source: Ambulatory Visit | Attending: Obstetrics and Gynecology | Admitting: Obstetrics and Gynecology

## 2017-01-04 ENCOUNTER — Ambulatory Visit
Admission: RE | Admit: 2017-01-04 | Discharge: 2017-01-04 | Disposition: A | Discharge status: Home / Self Care | Payer: Self-pay | Source: Ambulatory Visit | Attending: Obstetrics and Gynecology | Admitting: Obstetrics and Gynecology

## 2017-01-04 VITALS — BP 138/72 | Temp 98.5°F | Ht 62.0 in | Wt 237.0 lb

## 2017-01-04 DIAGNOSIS — N644 Mastodynia: Secondary | ICD-10-CM

## 2017-01-04 DIAGNOSIS — Z1239 Encounter for other screening for malignant neoplasm of breast: Secondary | ICD-10-CM

## 2017-01-04 HISTORY — DX: Hyperlipidemia, unspecified: E78.5

## 2017-01-04 HISTORY — DX: Gastro-esophageal reflux disease without esophagitis: K21.9

## 2017-01-04 NOTE — Addendum Note (Signed)
Encounter addended by: Priscille HeidelbergBrannock, Laurice Iglesia P, RN on: 01/04/2017 12:10 PM  Actions taken: Sign clinical note

## 2017-01-04 NOTE — Progress Notes (Addendum)
Complaints of bilateral breast pain in the nipple area that comes and goes. Patient rates the pain at a 5 out of 10.   Pap Smear: Pap smear not completed today. Last Pap smear was in 2018 at Dr. Diamantina Providenceean's office and normal per patient. Per patient has no history of an abnormal Pap smear. No Pap smear results are in Epic.  Physical exam: Breasts Breasts symmetrical. No skin abnormalities bilateral breasts. No nipple retraction bilateral breasts. No nipple discharge bilateral breasts. No lymphadenopathy. No lumps palpated bilateral breasts. Complaints of right breast tenderness at nipple area on exam. [facility]. Appointment scheduled for Tuesday, January 04, 2017 at 1050.        Pelvic/Bimanual No Pap smear completed today since last Pap smear was in 2018 per patient. Pap smear not indicated per B[facility]CP guidelines.   Smoking History: Patient is a former smoker that quit 6 years.  Patient Navigation: Patient education provided. Access to services provided for patient through B[facility]CP program.   Colorectal Cancer Screening: Per patient has never had a colonoscopy completed. No complaints today. FIT Test given to patient to complete and return to [facility]CP.

## 2017-01-04 NOTE — Patient Instructions (Addendum)
Explained breast self awareness with Rea Collegearolyn J Fusselman. Patient did not need a Pap smear today due to last Pap smear was in 2018 per patient. Let her know [facility]CP will cover Pap smears every 3 years unless has a history of abnormal Pap smears. [facility]. Appointment scheduled for Tuesday, January 04, 2017 at 1050. Bartholome BillCarolyn J Hugh verbalized understanding.  Nyko Gell, Kathaleen Maserhristine Poll, RN 9:36 AM

## 2017-01-05 ENCOUNTER — Encounter (HOSPITAL_COMMUNITY): Payer: Self-pay | Admitting: *Deleted

## 2018-08-15 ENCOUNTER — Other Ambulatory Visit: Payer: No Typology Code available for payment source

## 2018-08-15 ENCOUNTER — Other Ambulatory Visit: Payer: Self-pay

## 2018-08-15 DIAGNOSIS — Z20822 Contact with and (suspected) exposure to covid-19: Secondary | ICD-10-CM

## 2018-08-21 LAB — NOVEL CORONAVIRUS, NAA: SARS-CoV-2, NAA: NOT DETECTED

## 2019-10-31 ENCOUNTER — Other Ambulatory Visit: Payer: Self-pay | Admitting: Nurse Practitioner

## 2019-10-31 DIAGNOSIS — K5900 Constipation, unspecified: Secondary | ICD-10-CM

## 2019-10-31 DIAGNOSIS — R109 Unspecified abdominal pain: Secondary | ICD-10-CM

## 2019-10-31 DIAGNOSIS — K429 Umbilical hernia without obstruction or gangrene: Secondary | ICD-10-CM

## 2019-11-01 ENCOUNTER — Other Ambulatory Visit: Payer: Self-pay | Admitting: Nurse Practitioner

## 2019-11-05 ENCOUNTER — Ambulatory Visit
Admission: RE | Admit: 2019-11-05 | Discharge: 2019-11-05 | Disposition: A | Discharge status: Home / Self Care | Payer: No Typology Code available for payment source | Source: Ambulatory Visit | Attending: Nurse Practitioner | Admitting: Nurse Practitioner

## 2019-11-05 DIAGNOSIS — K5900 Constipation, unspecified: Secondary | ICD-10-CM

## 2019-11-05 DIAGNOSIS — R109 Unspecified abdominal pain: Secondary | ICD-10-CM

## 2019-11-05 DIAGNOSIS — K429 Umbilical hernia without obstruction or gangrene: Secondary | ICD-10-CM

## 2019-11-08 ENCOUNTER — Telehealth: Payer: Self-pay

## 2019-11-08 NOTE — Telephone Encounter (Signed)
Telephoned patient at home number. Left voice message with B[facility]CP contact information. 

## 2020-12-18 ENCOUNTER — Encounter: Payer: Self-pay | Admitting: Nurse Practitioner

## 2020-12-18 NOTE — Progress Notes (Signed)
Cardiology Office Note:    Date:  12/18/2020   ID:  Mindy Stevens, DOB 1955-09-08, MRN [id number]  PCP:  Sherral Hammers, FNP   [facility] Providers Cardiologist:  None     Referring MD: Dot Been, FNP   No chief complaint on file. Atypical CP/DOE  History of Present Illness:    Mindy Stevens is a 65 y.o. female with a hx of prior COVID19, chronic pain,  fibromyalgia, obesity, GERD, referral for atypical cp/ DOE  After she eats lunch her chest hurts. It lasts for 10 minutes. If she walks down the hall and comes back, it will start hurting. When she sits to rest it feels better. It lasts 2 minutes. She can only walk half way up a flight of stairs 2/2 sob. She took an aspirin with cp episodes.  The pain does not radiate. This has happened for 3 months to 4 months. She smoked for 10-15 years and then quit 8-9 years ago. She snores and concerned for sleep apnea.  She denies lower extremity edema, PND or orthopnea. No heart dx in her parents. Siblings have no heart disease. She's had no stress test or LHC. She had 2 premature children, one term child. She is working with a nutritionist.    01/28/2020: NSR , Qtc 446 ms  CVD Risk/Equivalent: HLD- yes, on atorvastatin HTN- yes PAD- No DMII - no Smoker-former Stroke-no Premature Family History- No   Past Medical History:  Diagnosis Date   GERD (gastroesophageal reflux disease)    Hyperlipidemia     No past surgical history on file.  Current Medications: No outpatient medications have been marked as taking for the 12/19/20 encounter (Appointment) with Maisie Fus, MD.     Allergies:   Patient has no known allergies.   Social History   Socioeconomic History   Marital status: Single    Spouse name: Not on file   Number of children: Not on file   Years of education: Not on file   Highest education level: Not on file  Occupational History   Not on file  Tobacco Use   Smoking status: Former   Smokeless  tobacco: Never  Vaping Use   Vaping Use: Never used  Substance and Sexual Activity   Alcohol use: No   Drug use: No   Sexual activity: Never  Other Topics Concern   Not on file  Social History Narrative   Not on file   Social Determinants of Health   Financial Resource Strain: Not on file  Food Insecurity: Not on file  Transportation Needs: Not on file  Physical Activity: Not on file  Stress: Not on file  Social Connections: Not on file     Family History: The patient's family history includes Breast cancer in her paternal aunt.  ROS:   Please see the history of present illness.     All other systems reviewed and are negative.  EKGs/Labs/Other Studies Reviewed:    The following studies were reviewed today:   EKG:  EKG is  ordered today.  The ekg ordered today demonstrates  NSR, no ischemic changes  Recent Labs: No results found for requested labs within last 8760 hours.  Recent Lipid Panel No results found for: CHOL, TRIG, HDL, CHOLHDL, VLDL, LDLCALC, LDLDIRECT   Risk Assessment/Calculations:         No lipid profile to assess ASCVD  Physical Exam:    VS:   Vitals:   12/19/20 0951  BP:  110/64  Pulse: 81  Resp: 20  SpO2: 96%     Wt Readings from Last 3 Encounters:  01/04/17 237 lb (107.5 kg)  08/23/15 222 lb (100.7 kg)     GEN:  Well nourished, well developed in no acute distress HEENT: Normal NECK: No JVD; No carotid bruits LYMPHATICS: No lymphadenopathy CARDIAC: RRR, no murmurs, rubs, gallops RESPIRATORY:  Clear to auscultation without rales, wheezing or rhonchi  ABDOMEN: Soft, non-tender, non-distended MUSCULOSKELETAL:  No edema; No deformity  SKIN: Warm and dry NEUROLOGIC:  Alert and oriented x 3 PSYCHIATRIC:  Normal affect   ASSESSMENT:    #DOE: she has risk for CVD including age, obesity, HLD, and former smoker. Her CP is atypical however she does have some DOE. Women can present atypically. Will opt for NM nuclear stress to assess  for ischemic heart dx. If it is normal, she can follow up PRN; if abnormal plan will be LHC. Also recommend diet and exercise counseling. Recommend a sleep study to be ordered by her primary provider.  PLAN:    In order of problems listed above:  NM SPECT Follow up PRN     Shared Decision Making/Informed Consent The risks [chest pain, shortness of breath, cardiac arrhythmias, dizziness, blood pressure fluctuations, myocardial infarction, stroke/transient ischemic attack, nausea, vomiting, allergic reaction, radiation exposure, metallic taste sensation and life-threatening complications (estimated to be 1 in 10,000)], benefits (risk stratification, diagnosing coronary artery disease, treatment guidance) and alternatives of a nuclear stress test were discussed in detail with Mindy Stevens and she agrees to proceed.    Medication Adjustments/Labs and Tests Ordered: Current medicines are reviewed at length with the patient today.  Concerns regarding medicines are outlined above.   Signed, Maisie Fus, MD  12/18/2020 9:04 PM    [facility]

## 2020-12-19 ENCOUNTER — Encounter: Payer: Self-pay | Admitting: Internal Medicine

## 2020-12-19 ENCOUNTER — Other Ambulatory Visit: Payer: Self-pay

## 2020-12-19 ENCOUNTER — Ambulatory Visit (INDEPENDENT_AMBULATORY_CARE_PROVIDER_SITE_OTHER): Payer: Self-pay | Admitting: Internal Medicine

## 2020-12-19 VITALS — BP 110/64 | HR 81 | Resp 20 | Ht 64.0 in | Wt 236.0 lb

## 2020-12-19 DIAGNOSIS — R0600 Dyspnea, unspecified: Secondary | ICD-10-CM

## 2020-12-19 DIAGNOSIS — R079 Chest pain, unspecified: Secondary | ICD-10-CM

## 2020-12-19 NOTE — Patient Instructions (Addendum)
Medication Instructions:  Your physician recommends that you continue on your current medications as directed. Please refer to the Current Medication list given to you today.  *If you need a refill on your cardiac medications before your next appointment, please call your pharmacy*  Testing/Procedures: Your physician has requested that you have a lexiscan myoview (2 day). For further information please visit [email or web address] Please follow instruction sheet, as given. This will take place at [address] [facility], [address]  How to prepare for your Myocardial Perfusion Test: Do not eat or drink 3 hours prior to your test, except you may have water. Do not consume products containing caffeine (regular or decaffeinated) 12 hours prior to your test. (ex: coffee, chocolate, sodas, tea). Do bring a list of your current medications with you.  If not listed below, you may take your medications as normal. Do wear comfortable clothes (no dresses or overalls) and walking shoes, tennis shoes preferred (No heels or open toe shoes are allowed). Do NOT wear cologne, perfume, aftershave, or lotions (deodorant is allowed). The test will take approximately 3 to 4 hours to complete If these instructions are not followed, your test will have to be rescheduled.  Follow-Up: At [facility], you and your health needs are our priority.  As part of our continuing mission to provide you with exceptional heart care, we have created designated Provider Care Teams.  These Care Teams include your primary Cardiologist (physician) and Advanced Practice Providers (APPs -  Physician Assistants and Nurse Practitioners) who all work together to provide you with the care you need, when you need it.  We recommend signing up for the patient portal called "MyChart".  Sign up information is provided on this After Visit Summary.  MyChart is used to connect with patients for Virtual Visits (Telemedicine).  Patients are able to view  lab/test results, encounter notes, upcoming appointments, etc.  Non-urgent messages can be sent to your provider as well.   To learn more about what you can do with MyChart, go to [email or web address].    Your next appointment:   AS NEEDED with Dr. Wyline Mood

## 2020-12-30 ENCOUNTER — Telehealth (HOSPITAL_COMMUNITY): Payer: Self-pay | Admitting: *Deleted

## 2020-12-30 NOTE — Telephone Encounter (Signed)
Close encounter 

## 2020-12-31 ENCOUNTER — Other Ambulatory Visit: Payer: Self-pay

## 2020-12-31 ENCOUNTER — Ambulatory Visit (HOSPITAL_COMMUNITY)
Admission: RE | Admit: 2020-12-31 | Discharge: 2020-12-31 | Disposition: A | Discharge status: Home / Self Care | Payer: Self-pay | Source: Ambulatory Visit | Attending: Cardiovascular Disease | Admitting: Cardiovascular Disease

## 2020-12-31 DIAGNOSIS — R079 Chest pain, unspecified: Secondary | ICD-10-CM | POA: Insufficient documentation

## 2020-12-31 MED ORDER — REGADENOSON 0.4 MG/5ML IV SOLN
0.4000 mg | Freq: Once | INTRAVENOUS | Status: AC
  Administered 2020-12-31: 0.4 mg via INTRAVENOUS

## 2020-12-31 MED ORDER — TECHNETIUM TC 99M TETROFOSMIN IV KIT
25.0000 | PACK | Freq: Once | INTRAVENOUS | Status: AC | PRN
  Administered 2020-12-31: 25 via INTRAVENOUS
  Filled 2020-12-31: qty 25

## 2020-12-31 MED ORDER — AMINOPHYLLINE 25 MG/ML IV SOLN
75.0000 mg | Freq: Once | INTRAVENOUS | Status: AC
  Administered 2020-12-31: 75 mg via INTRAVENOUS

## 2021-01-01 ENCOUNTER — Ambulatory Visit (HOSPITAL_COMMUNITY)
Admission: RE | Admit: 2021-01-01 | Discharge: 2021-01-01 | Disposition: A | Discharge status: Home / Self Care | Payer: Self-pay | Source: Ambulatory Visit | Attending: Cardiology | Admitting: Cardiology

## 2021-01-01 ENCOUNTER — Inpatient Hospital Stay (HOSPITAL_COMMUNITY): Admission: RE | Admit: 2021-01-01 | Payer: Medicaid Other | Source: Ambulatory Visit

## 2021-01-01 LAB — MYOCARDIAL PERFUSION IMAGING
LV dias vol: 58 mL (ref 46–106)
LV sys vol: 16 mL
Nuc Stress EF: 73 %
Peak HR: 99 {beats}/min
Rest HR: 73 {beats}/min
Rest Nuclear Isotope Dose: 25.5 mCi
SDS: 0
SRS: 0
SSS: 0
ST Depression (mm): 0 mm
Stress Nuclear Isotope Dose: 25 mCi
TID: 1.09

## 2021-01-01 MED ORDER — TECHNETIUM TC 99M TETROFOSMIN IV KIT
25.5000 | PACK | Freq: Once | INTRAVENOUS | Status: AC | PRN
  Administered 2021-01-01: 08:00:00 25.5 via INTRAVENOUS

## 2021-01-27 ENCOUNTER — Ambulatory Visit: Payer: No Typology Code available for payment source | Admitting: Dermatology

## 2021-09-30 ENCOUNTER — Other Ambulatory Visit: Payer: Self-pay | Admitting: Physician Assistant

## 2021-09-30 DIAGNOSIS — Z1231 Encounter for screening mammogram for malignant neoplasm of breast: Secondary | ICD-10-CM

## 2021-09-30 DIAGNOSIS — Z0000 Encounter for general adult medical examination without abnormal findings: Secondary | ICD-10-CM

## 2021-11-16 ENCOUNTER — Other Ambulatory Visit: Payer: Self-pay | Admitting: Physician Assistant

## 2021-11-16 DIAGNOSIS — Z13820 Encounter for screening for osteoporosis: Secondary | ICD-10-CM

## 2021-11-25 ENCOUNTER — Ambulatory Visit
Admission: RE | Admit: 2021-11-25 | Discharge: 2021-11-25 | Disposition: A | Discharge status: Home / Self Care | Payer: Medicare Other | Source: Ambulatory Visit | Attending: Family Medicine | Admitting: Family Medicine

## 2021-11-25 ENCOUNTER — Other Ambulatory Visit: Payer: Self-pay | Admitting: Family Medicine

## 2021-11-25 DIAGNOSIS — M1990 Unspecified osteoarthritis, unspecified site: Secondary | ICD-10-CM

## 2021-12-30 ENCOUNTER — Ambulatory Visit: Payer: Self-pay

## 2021-12-30 ENCOUNTER — Ambulatory Visit (INDEPENDENT_AMBULATORY_CARE_PROVIDER_SITE_OTHER): Payer: Medicare Other

## 2021-12-30 ENCOUNTER — Ambulatory Visit (INDEPENDENT_AMBULATORY_CARE_PROVIDER_SITE_OTHER): Payer: Medicare Other | Admitting: Orthopaedic Surgery

## 2021-12-30 ENCOUNTER — Encounter: Payer: Self-pay | Admitting: Sports Medicine

## 2021-12-30 DIAGNOSIS — M25511 Pain in right shoulder: Secondary | ICD-10-CM

## 2021-12-30 DIAGNOSIS — G8929 Other chronic pain: Secondary | ICD-10-CM

## 2021-12-30 DIAGNOSIS — M25512 Pain in left shoulder: Secondary | ICD-10-CM

## 2021-12-30 MED ORDER — LIDOCAINE HCL 1 % IJ SOLN
2.0000 mL | INTRAMUSCULAR | Status: AC | PRN
  Administered 2021-12-30: 2 mL

## 2021-12-30 MED ORDER — BUPIVACAINE HCL 0.25 % IJ SOLN
2.0000 mL | INTRAMUSCULAR | Status: AC | PRN
  Administered 2021-12-30: 2 mL via INTRA_ARTICULAR

## 2021-12-30 MED ORDER — METHYLPREDNISOLONE ACETATE 40 MG/ML IJ SUSP
80.0000 mg | INTRAMUSCULAR | Status: AC | PRN
  Administered 2021-12-30: 80 mg via INTRA_ARTICULAR

## 2021-12-30 NOTE — Progress Notes (Signed)
Office Visit Note   Patient: Mindy Stevens           Date of Birth: Jun 11, 1955           MRN: [id number] Visit Date: 12/30/2021              Requested by: Dot Been, FNP [address] [place],  [place] [address] PCP: Dot Been, FNP   Assessment & Plan: Visit Diagnoses:  1. Acute pain of both shoulders     Plan: Impression is bilateral shoulder pain.  Possible conditions reviewed with the patient and treatment options were reviewed.  We will make a referral to outpatient physical therapy and I will set her up with intra-articular cortisone injections with Dr. Shon Baton.  She is to follow-up with [place] in about 4 weeks if symptoms do not improve.  We also consider that her symptoms may be coming from her neck or that she may also have carpal tunnel syndrome.  Follow-Up Instructions: No follow-ups on file.   Orders:  Orders Placed This Encounter  Procedures   XR Shoulder Right   XR Shoulder Left   Ambulatory referral to Physical Therapy   No orders of the defined types were placed in this encounter.     Procedures: No procedures performed   Clinical Data: No additional findings.   Subjective: Chief Complaint  Patient presents with   Right Shoulder - Pain   Left Shoulder - Pain    HPI Mindy Stevens is a 66 year old female here for bilateral shoulder pain that is constant.  She has had pain for years.  Denies any injuries.  She feels pain along the anterior joint line.  Does report some neck pain and numbness in her hands.  Review of Systems  Constitutional: Negative.   HENT: Negative.    Eyes: Negative.   Respiratory: Negative.    Cardiovascular: Negative.   Endocrine: Negative.   Musculoskeletal: Negative.   Neurological: Negative.   Hematological: Negative.   Psychiatric/Behavioral: Negative.    All other systems reviewed and are negative.    Objective: Vital Signs: There were no vitals taken for this visit.  Physical Exam Vitals and nursing note  reviewed.  Constitutional:      Appearance: She is well-developed.  HENT:     Head: Atraumatic.     Nose: Nose normal.  Eyes:     Extraocular Movements: Extraocular movements intact.  Cardiovascular:     Pulses: Normal pulses.  Pulmonary:     Effort: Pulmonary effort is normal.  Abdominal:     Palpations: Abdomen is soft.  Musculoskeletal:     Cervical back: Neck supple.  Skin:    General: Skin is warm.     Capillary Refill: Capillary refill takes less than 2 seconds.  Neurological:     Mental Status: She is alert and oriented to person, place, and time. Mental status is at baseline.  Psychiatric:        Behavior: Behavior normal.        Thought Content: Thought content normal.        Judgment: Judgment normal.     Ortho Exam Examination of bilateral shoulders shows normal passive and active range of motion with moderate pain.  Manual muscle testing of the rotator cuff shows intact function with some slight weakness secondary to breakthrough pain.  She has pain with impingement testing.  No focal motor or sensory deficits of the upper extremities. Specialty Comments:  No specialty comments available.  Imaging:  XR Shoulder Right  Result Date: 12/30/2021 Three-view x-rays of the right shoulder shows calcific densities in the subacromial space.  Mild glenohumeral arthritis.  Type II acromion.  XR Shoulder Left  Result Date: 12/30/2021 Three-view x-rays left shoulder shows mild glenohumeral osteoarthritis.  No acute abnormalities.  Type II acromion.    PMFS History: Patient Active Problem List   Diagnosis Date Noted   Dyspnea 12/19/2020   Past Medical History:  Diagnosis Date   GERD (gastroesophageal reflux disease)    Hyperlipidemia     Family History  Problem Relation Age of Onset   Breast cancer Paternal Aunt     No past surgical history on file. Social History   Occupational History   Not on file  Tobacco Use   Smoking status: Former   Smokeless  tobacco: Never  Building services engineer Use: Never used  Substance and Sexual Activity   Alcohol use: No   Drug use: No   Sexual activity: Never

## 2021-12-30 NOTE — Addendum Note (Signed)
Addended by: Ellis Savage on: 12/30/2021 09:35 AM   Modules accepted: Orders

## 2021-12-30 NOTE — Progress Notes (Signed)
   Procedure Note  Patient: Mindy Stevens             Date of Birth: 1955-11-10           MRN: [id number]             Visit Date: 12/30/2021  Procedures: Visit Diagnoses:  1. Acute pain of both shoulders    Large Joint Inj: L glenohumeral on 12/30/2021 9:57 AM Indications: pain Details: 22 G 3.5 in needle, ultrasound-guided posterior approach Medications: 2 mL lidocaine 1 %; 2 mL bupivacaine 0.25 %; 80 mg methylPREDNISolone acetate 40 MG/ML Outcome: tolerated well, no immediate complications  US-guided glenohumeral joint injection, left shoulder After discussion on risks/benefits/indications, informed verbal consent was obtained. A timeout was then performed. The patient was positioned lying lateral recumbent on examination table. The patient's shoulder was prepped with betadine and multiple alcohol swabs and utilizing ultrasound guidance, the patient's glenohumeral joint was identified on ultrasound. Using ultrasound guidance a 22-gauge, 3.5 inch needle with a mixture of 2:2:2 cc's lidocaine:bupivicaine:depomedrol was directed from a lateral to medial direction via in-plane technique into the glenohumeral joint with visualization of appropriate spread of injectate into the joint. Patient tolerated the procedure well without immediate complications.      Procedure, treatment alternatives, risks and benefits explained, specific risks discussed. Consent was given by the patient. Immediately prior to procedure a time out was called to verify the correct patient, procedure, equipment, support staff and site/side marked as required. Patient was prepped and draped in the usual sterile fashion.     - I evaluated the patient about 10 minutes post-injection and they had improvement in pain and range of motion - follow-up with Dr. Roda Shutters as indicated; I am happy to see them as needed -She will follow-up in 1 week for the contralateral shoulder injection if she does find relief from today's  injection  Madelyn Brunner, DO Primary Care Sports Medicine Physician  [facility] - [facility]  This note was dictated using Dragon naturally speaking software and may contain errors in syntax, spelling, or content which have not been identified prior to signing this note.

## 2022-01-06 ENCOUNTER — Ambulatory Visit: Payer: Medicare Other | Admitting: Sports Medicine

## 2022-01-18 ENCOUNTER — Ambulatory Visit: Payer: Self-pay

## 2022-01-18 ENCOUNTER — Encounter: Payer: Self-pay | Admitting: Sports Medicine

## 2022-01-18 ENCOUNTER — Ambulatory Visit (INDEPENDENT_AMBULATORY_CARE_PROVIDER_SITE_OTHER): Payer: Medicare Other | Admitting: Sports Medicine

## 2022-01-18 DIAGNOSIS — M25511 Pain in right shoulder: Secondary | ICD-10-CM

## 2022-01-18 DIAGNOSIS — G8929 Other chronic pain: Secondary | ICD-10-CM

## 2022-01-18 MED ORDER — LIDOCAINE HCL 1 % IJ SOLN
2.0000 mL | INTRAMUSCULAR | Status: AC | PRN
  Administered 2022-01-18: 2 mL

## 2022-01-18 MED ORDER — BUPIVACAINE HCL 0.25 % IJ SOLN
2.0000 mL | INTRAMUSCULAR | Status: AC | PRN
  Administered 2022-01-18: 2 mL via INTRA_ARTICULAR

## 2022-01-18 MED ORDER — METHYLPREDNISOLONE ACETATE 40 MG/ML IJ SUSP
80.0000 mg | INTRAMUSCULAR | Status: AC | PRN
  Administered 2022-01-18: 80 mg via INTRA_ARTICULAR

## 2022-01-18 NOTE — Progress Notes (Signed)
   Procedure Note  Patient: Mindy Stevens             Date of Birth: Nov 10, 1955           MRN: [id number]             Visit Date: 01/18/2022  Procedures: Visit Diagnoses:  1. Chronic right shoulder pain    Large Joint Inj: R glenohumeral on 01/18/2022 10:41 AM Indications: pain Details: 22 G 3.5 in needle, ultrasound-guided posterior approach Medications: 2 mL lidocaine 1 %; 2 mL bupivacaine 0.25 %; 80 mg methylPREDNISolone acetate 40 MG/ML Outcome: tolerated well, no immediate complications  US-guided glenohumeral joint injection, right shoulder After discussion on risks/benefits/indications, informed verbal consent was obtained. A timeout was then performed. The patient was positioned lying lateral recumbent on examination table. The patient's shoulder was prepped with betadine and multiple alcohol swabs and utilizing ultrasound guidance, the patient's glenohumeral joint was identified on ultrasound. Using ultrasound guidance a 22-gauge, 3.5 inch needle with a mixture of 2:2:2 cc's lidocaine:bupivicaine:depomedrol was directed from a lateral to medial direction via in-plane technique into the glenohumeral joint with visualization of appropriate spread of injectate into the joint. Patient tolerated the procedure well without immediate complications.      Procedure, treatment alternatives, risks and benefits explained, specific risks discussed. Consent was given by the patient. Immediately prior to procedure a time out was called to verify the correct patient, procedure, equipment, support staff and site/side marked as required. Patient was prepped and draped in the usual sterile fashion.     - I evaluated the patient about 10 minutes post-injection and she had improvement in pain and range of motion - follow-up with Dr. Roda Shutters as indicated; I am happy to see them as needed  Madelyn Brunner, DO Primary Care Sports Medicine Physician  [facility] - [facility]  This note was  dictated using Dragon naturally speaking software and may contain errors in syntax, spelling, or content which have not been identified prior to signing this note.

## 2022-01-21 ENCOUNTER — Ambulatory Visit
Admission: RE | Admit: 2022-01-21 | Discharge: 2022-01-21 | Disposition: A | Discharge status: Home / Self Care | Payer: Medicare Other | Source: Ambulatory Visit | Attending: Physician Assistant | Admitting: Physician Assistant

## 2022-01-21 DIAGNOSIS — Z13820 Encounter for screening for osteoporosis: Secondary | ICD-10-CM

## 2022-04-26 ENCOUNTER — Ambulatory Visit (INDEPENDENT_AMBULATORY_CARE_PROVIDER_SITE_OTHER): Payer: 59 | Admitting: Orthopaedic Surgery

## 2022-04-26 DIAGNOSIS — M7542 Impingement syndrome of left shoulder: Secondary | ICD-10-CM | POA: Insufficient documentation

## 2022-04-26 MED ORDER — BUPIVACAINE HCL 0.25 % IJ SOLN
4.0000 mL | INTRAMUSCULAR | Status: AC | PRN
  Administered 2022-04-26: 4 mL via INTRA_ARTICULAR

## 2022-04-26 MED ORDER — METHYLPREDNISOLONE ACETATE 40 MG/ML IJ SUSP
40.0000 mg | INTRAMUSCULAR | Status: AC | PRN
  Administered 2022-04-26: 40 mg via INTRA_ARTICULAR

## 2022-04-26 MED ORDER — LIDOCAINE HCL 1 % IJ SOLN
0.5000 mL | INTRAMUSCULAR | Status: AC | PRN
  Administered 2022-04-26: .5 mL

## 2022-04-26 NOTE — Progress Notes (Signed)
Office Visit Note   Patient: Mindy Stevens           Date of Birth: 21-Dec-1955           MRN: [id number] Visit Date: 04/26/2022              Requested by: Joycelyn Man, [address] [place],  [place] [address] PCP: Joycelyn Man, FNP   Assessment & Plan: Visit Diagnoses:  1. Impingement syndrome of left shoulder     Plan: Repeat subacromial injection performed left shoulder if she still has problems she will call us and we will proceed with MRI scan of her left shoulder to rule out supraspinatus tear.  We discussed avoiding outstretched and overhead activities is much as possible to decrease her symptoms.  Follow-Up Instructions: No follow-ups on file.   Orders:  No orders of the defined types were placed in this encounter.  No orders of the defined types were placed in this encounter.     Procedures: Large Joint Inj: L subacromial bursa on 04/26/2022 11:43 AM Indications: pain Details: 22 G 1.5 in needle  Arthrogram: No  Medications: 4 mL bupivacaine 0.25 %; 40 mg methylPREDNISolone acetate 40 MG/ML; 0.5 mL lidocaine 1 % Outcome: tolerated well, no immediate complications Procedure, treatment alternatives, risks and benefits explained, specific risks discussed. Consent was given by the patient. Immediately prior to procedure a time out was called to verify the correct patient, procedure, equipment, support staff and site/side marked as required. Patient was prepped and draped in the usual sterile fashion.       Clinical Data: No additional findings.   Subjective: Chief Complaint  Patient presents with   Left Shoulder - Pain    HPI 67 year old female seen for recurrent left shoulder pain.  She had previous injection in December by Dr. Rolena Infante and gave her 1 month relief and recurrence of symptoms.  She is to work at [employer] and also at [employer] but had to do [employer] and overhead reaching and states that made her shoulder hurt too much.  Previous  x-rays obtained by Dr. Erlinda Hong showed supraspinatus calcification and areas suggested retracted tear 2 cm.  Patient states injection did work great for well over a month and then she is having significant pain and cannot sleep.  Review of Systems all other systems updated and unremarkable.   Objective: Vital Signs: There were no vitals taken for this visit.  Physical Exam Constitutional:      Appearance: She is well-developed.  HENT:     Head: Normocephalic.     Right Ear: External ear normal.     Left Ear: External ear normal. There is no impacted cerumen.  Eyes:     Pupils: Pupils are equal, round, and reactive to light.  Neck:     Thyroid: No thyromegaly.     Trachea: No tracheal deviation.  Cardiovascular:     Rate and Rhythm: Normal rate.  Pulmonary:     Effort: Pulmonary effort is normal.  Abdominal:     Palpations: Abdomen is soft.  Musculoskeletal:     Cervical back: No rigidity.  Skin:    General: Skin is warm and dry.  Neurological:     Mental Status: She is alert and oriented to person, place, and time.  Psychiatric:        Behavior: Behavior normal.     Ortho Exam left shoulder drop arm test positive impingement left shoulder.  Subscap is strong.  [place] cervical  range of motion.  Sensation hand is intact.  Specialty Comments:  No specialty comments available.  Imaging: No results found.   PMFS History: Patient Active Problem List   Diagnosis Date Noted   Impingement syndrome of left shoulder 04/26/2022   Dyspnea 12/19/2020   Past Medical History:  Diagnosis Date   GERD (gastroesophageal reflux disease)    Hyperlipidemia     Family History  Problem Relation Age of Onset   Breast cancer Paternal Aunt     No past surgical history on file. Social History   Occupational History   Not on file  Tobacco Use   Smoking status: Former   Smokeless tobacco: Never  Scientific laboratory technician Use: Never used  Substance and Sexual Activity   Alcohol use: No    Drug use: No   Sexual activity: Never

## 2022-05-08 IMAGING — US US ABDOMEN LIMITED
1 series · 8 of 8 positions shown · non-contrast
Comparison: None.

CLINICAL DATA: Umbilical hernia, abdominal pain, constipation

EXAM:
ULTRASOUND ABDOMEN LIMITED

[Series 1: us abdomen limited · 0.07mm/px · 8 acquisitions, 8 frames shown]
[im 1/8]
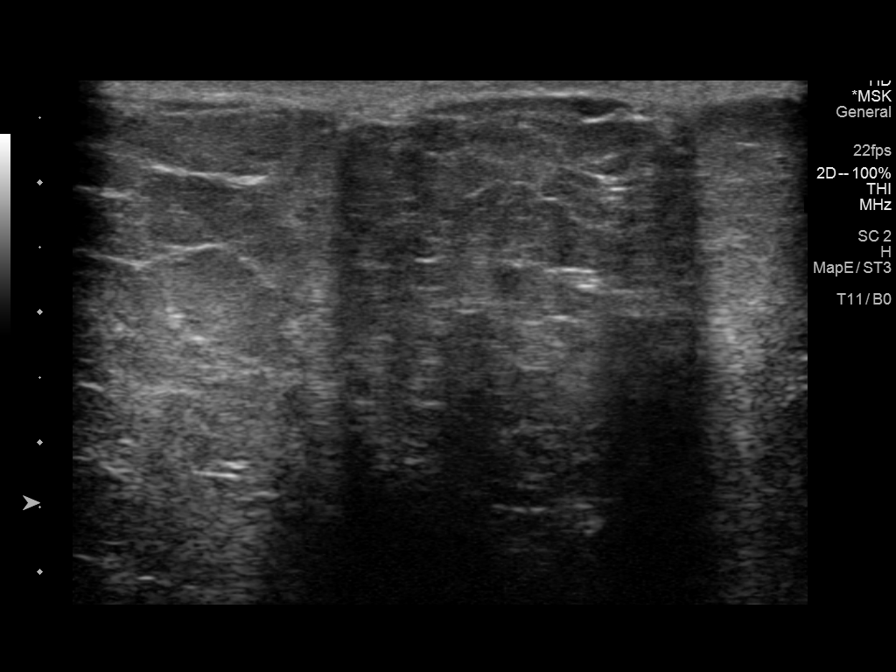
[im 2/8]
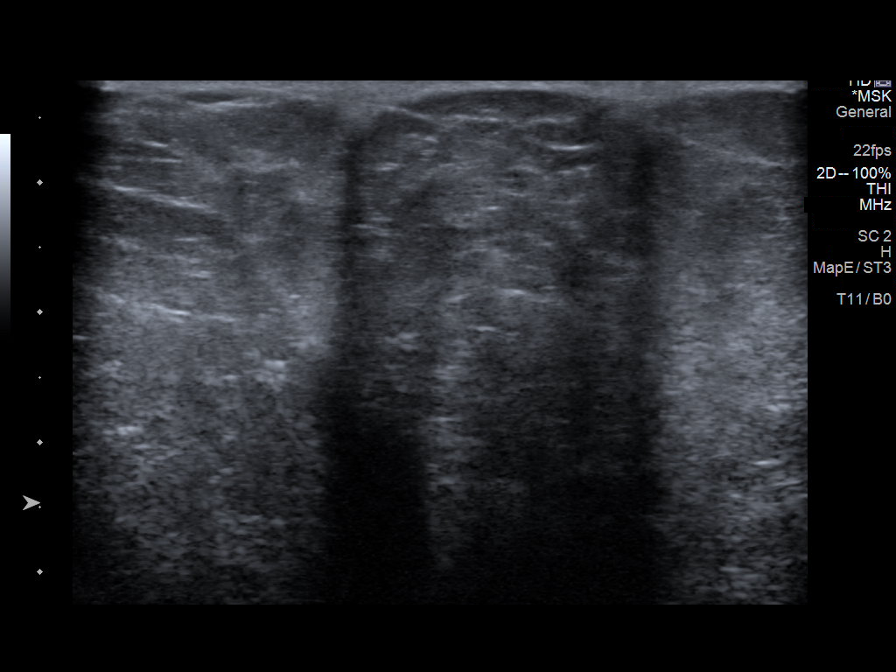
[im 3/8]
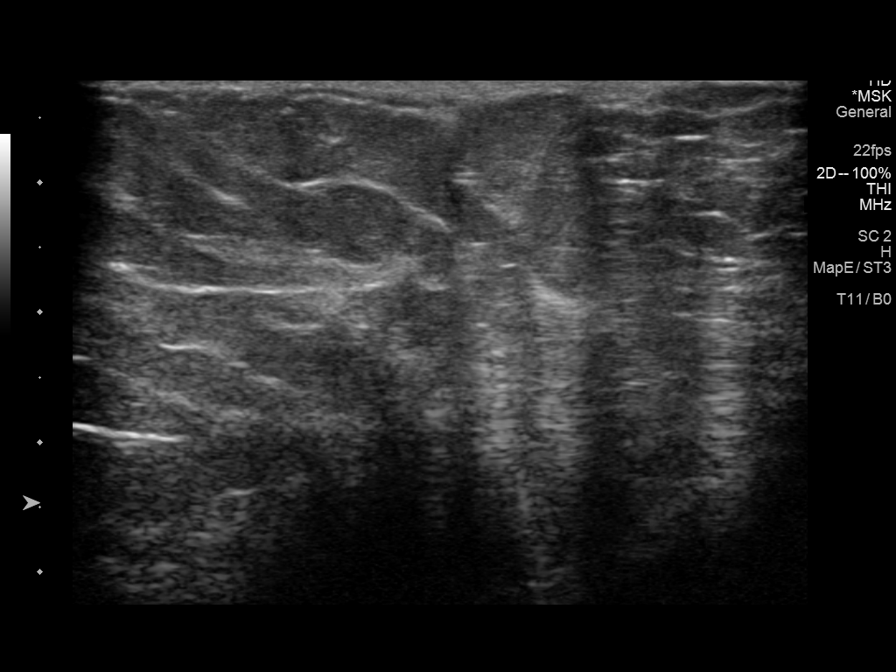
[im 4/8]
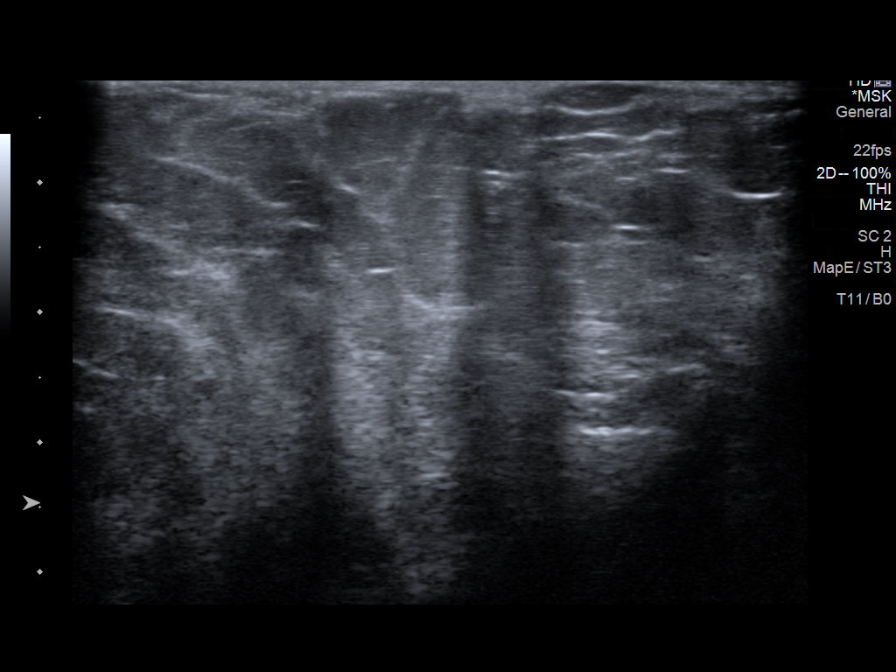
[im 5/8]
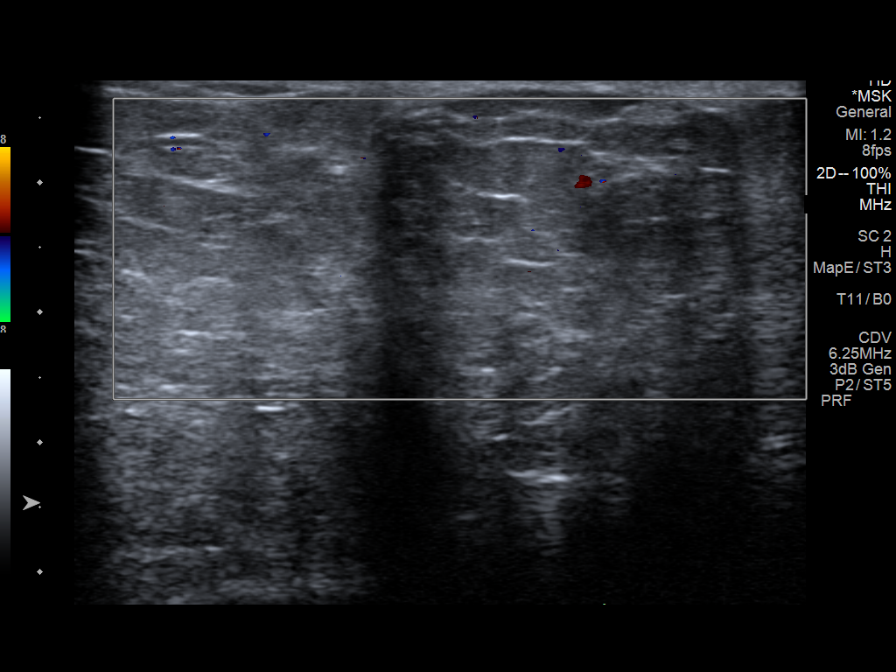
[im 6/8]
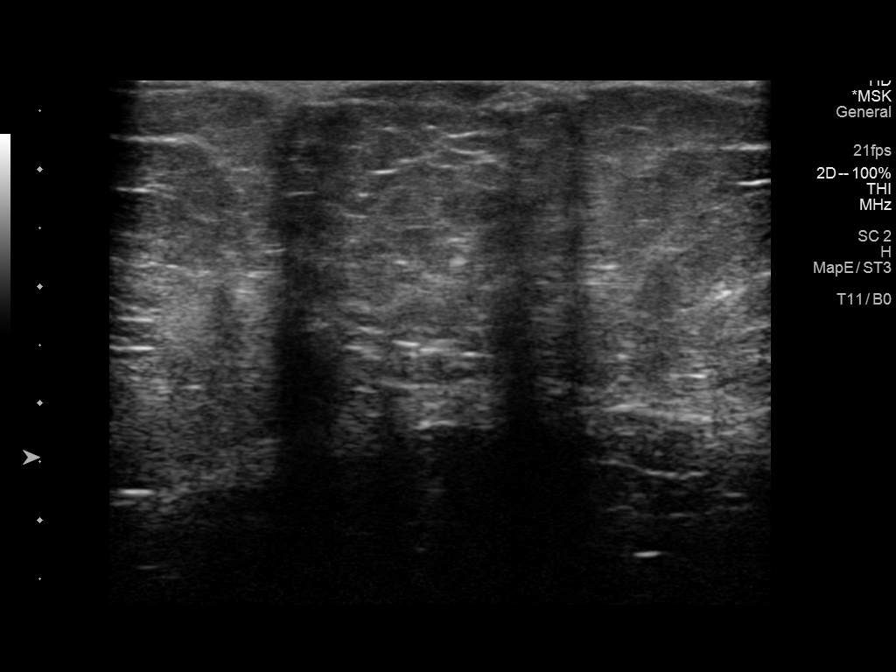
[im 7/8]
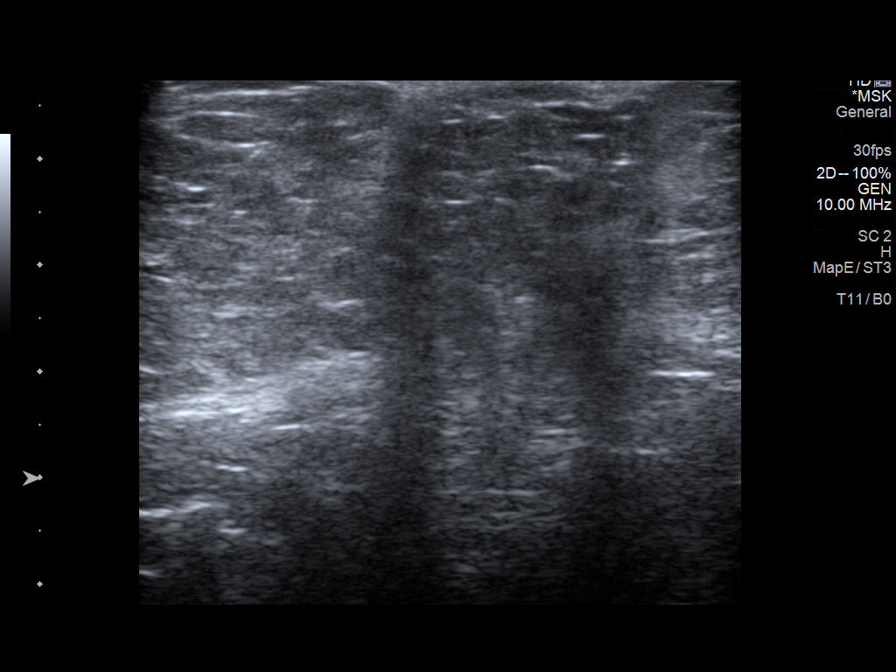
[im 8/8]
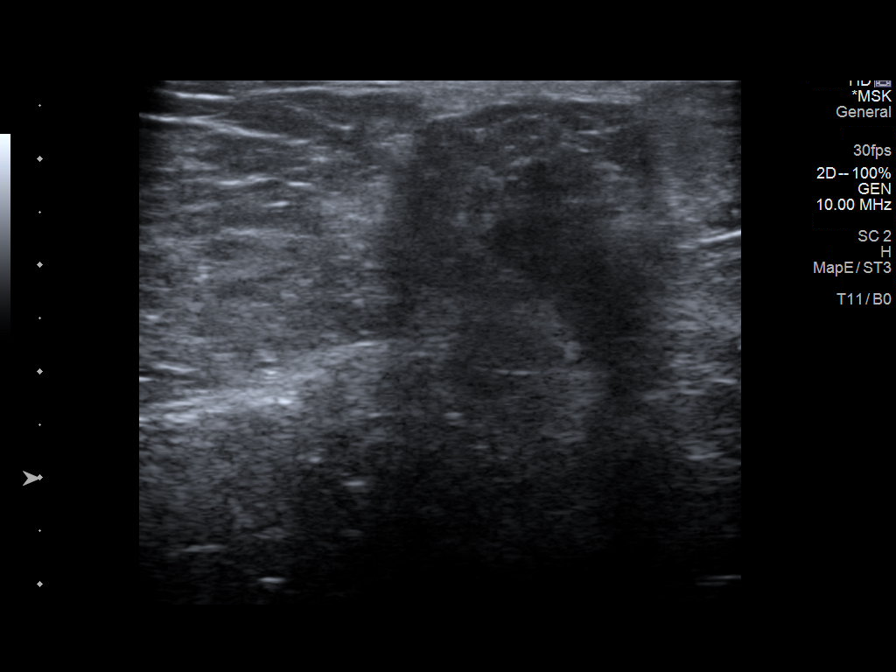

[8 of 8 positions shown; findings below may reference images not displayed]

FINDINGS: Limited grayscale and color Doppler static and cine images within
the supraumbilical region demonstrates a small sliding
supraumbilical ventral hernia containing mesenteric fat. The hernia
defect measures roughly 12 mm x 17 mm. The hernia sac measures 24 x
27 mm. No associated fluid within the hernia sac. No edema or
hyperemia within the surrounding soft tissues.
IMPRESSION: Sliding fat containing supraumbilical ventral hernia as described
above.

## 2022-05-27 ENCOUNTER — Telehealth: Payer: Self-pay | Admitting: Orthopaedic Surgery

## 2022-05-27 DIAGNOSIS — M7542 Impingement syndrome of left shoulder: Secondary | ICD-10-CM

## 2022-05-27 NOTE — Telephone Encounter (Signed)
FYI  MRI ordered per your last office note.  I left voicemail for patient advising.

## 2022-05-27 NOTE — Telephone Encounter (Signed)
Patient states her shoulder is not any better and she wants an MRI

## 2022-05-27 NOTE — Addendum Note (Signed)
Addended by: Rogers Seeds on: 05/27/2022 04:53 PM   Modules accepted: Orders

## 2022-06-15 ENCOUNTER — Ambulatory Visit (INDEPENDENT_AMBULATORY_CARE_PROVIDER_SITE_OTHER): Payer: 59 | Admitting: Sports Medicine

## 2022-06-15 DIAGNOSIS — G8929 Other chronic pain: Secondary | ICD-10-CM

## 2022-06-15 DIAGNOSIS — M7542 Impingement syndrome of left shoulder: Secondary | ICD-10-CM | POA: Diagnosis not present

## 2022-06-15 DIAGNOSIS — M25512 Pain in left shoulder: Secondary | ICD-10-CM | POA: Diagnosis not present

## 2022-06-15 MED ORDER — MELOXICAM 15 MG PO TABS: 15.0000 mg | ORAL_TABLET | Freq: Every day | ORAL | 1 refills | Status: DC

## 2022-06-15 NOTE — Progress Notes (Signed)
Mindy Stevens - 67 y.o. female MRN [id number]  Date of birth: 10/13/1955  Office Visit Note: Visit Date: 06/15/2022 PCP: Dot Been, FNP Referred by: Dot Been, FNP  Subjective: Chief Complaint  Patient presents with   Left Shoulder - Pain   HPI: Mindy Stevens is a pleasant 67 y.o. female who presents today for follow-up of acute on chronic left shoulder pain.  I did perform ultrasound-guided glenohumeral joint injection into the left shoulder on 12/30/2021.  Got about 1 month of relief.  Recently saw Dr. Ophelia Charter on 04/26/2022 and he provided subacromial joint injection of the left shoulder.  Mindy Stevens states that this did not give her much relief.  She does have an MRI ordered, has not yet been able to schedule it due miscommunication among scheduling of MRI.  Inquires about injection today.  Previous x-rays obtained by Dr. Roda Shutters showed supraspinatus calcification and areas suggested retracted RC tear.  Pertinent ROS were reviewed with the patient and found to be negative unless otherwise specified above in HPI.   Assessment & Plan: Visit Diagnoses:  1. Chronic left shoulder pain   2. Impingement syndrome of left shoulder    Plan: Aaron did get excellent relief for about 1 month from her prior ultrasound-guided glenohumeral joint injection, recently did not get much relief from the subacromial joint injection.  Her x-ray does suggest some calcific tendinopathy versus evidence of possible prior tear.  At this point, we discussed injection versus getting MRI first.  She would like to get an MRI first, did provide the number for her to call to set this up.  She will follow-up in 3 business days with myself or Dr. Ophelia Charter who ordered the tests.  Will start her on meloxicam 15 mg once daily for some pain control in the interim.  Encouraged to continue to keep the shoulder moving, may use ice as well for any pain or swelling.  MRI will guide next steps depending on tearing, calcific  tendinitis, GHJ arthritis.  Follow-up: Return for F/u 3 business days after MRI with myself or Dr. Ophelia Charter.   Meds & Orders:  Meds ordered this encounter  Medications   meloxicam (MOBIC) 15 MG tablet    Sig: Take 1 tablet (15 mg total) by mouth daily.    Dispense:  30 tablet    Refill:  1   No orders of the defined types were placed in this encounter.    Procedures: No procedures performed      Clinical History: No specialty comments available.  She reports that she has quit smoking. She has never used smokeless tobacco. No results for input(s): "HGBA1C", "LABURIC" in the last 8760 hours.  Objective:   Vital Signs: There were no vitals taken for this visit.  Physical Exam  Gen: Well-appearing, in no acute distress; non-toxic CV:  Well-perfused. Warm.  Resp: Breathing unlabored on room air; no wheezing. Psych: Fluid speech in conversation; appropriate affect; normal thought process Neuro: Sensation intact throughout. No gross coordination deficits.   Ortho Exam - Left shoulder: No redness or swelling.  Patient does have pain through all planes of motion today.  Positive pain at Codman's point.  There is some restriction in external rotation, likely secondary to guarding.  Imaging: - L shoulder x-ray 12/30/21: Three-view x-rays left shoulder shows mild glenohumeral osteoarthritis.   No acute abnormalities.  Type II acromion.   Past Medical/Family/Surgical/Social History: Medications & Allergies reviewed per EMR, new medications updated. Patient Active Problem List  Diagnosis Date Noted   Impingement syndrome of left shoulder 04/26/2022   Dyspnea 12/19/2020   Past Medical History:  Diagnosis Date   GERD (gastroesophageal reflux disease)    Hyperlipidemia    Family History  Problem Relation Age of Onset   Breast cancer Paternal Aunt    No past surgical history on file. Social History   Occupational History   Not on file  Tobacco Use   Smoking status: Former    Smokeless tobacco: Never  Building services engineer Use: Never used  Substance and Sexual Activity   Alcohol use: No   Drug use: No   Sexual activity: Never

## 2022-06-27 ENCOUNTER — Ambulatory Visit
Admission: RE | Admit: 2022-06-27 | Discharge: 2022-06-27 | Disposition: A | Discharge status: Home / Self Care | Payer: 59 | Source: Ambulatory Visit | Attending: Orthopaedic Surgery | Admitting: Orthopaedic Surgery

## 2022-06-27 DIAGNOSIS — M7542 Impingement syndrome of left shoulder: Secondary | ICD-10-CM

## 2022-06-29 ENCOUNTER — Telehealth: Payer: Self-pay | Admitting: Orthopaedic Surgery

## 2022-06-29 NOTE — Telephone Encounter (Signed)
Called pt 1X and left vm for pt to call and set MRI Review appt with Dr Ophelia Charter after 06/27/22

## 2022-07-13 ENCOUNTER — Encounter: Payer: Self-pay | Admitting: Orthopaedic Surgery

## 2022-07-13 ENCOUNTER — Ambulatory Visit (INDEPENDENT_AMBULATORY_CARE_PROVIDER_SITE_OTHER): Payer: 59 | Admitting: Orthopaedic Surgery

## 2022-07-13 VITALS — BP 117/71 | HR 90 | Ht 62.0 in | Wt 245.0 lb

## 2022-07-13 DIAGNOSIS — M47812 Spondylosis without myelopathy or radiculopathy, cervical region: Secondary | ICD-10-CM

## 2022-07-13 DIAGNOSIS — G8929 Other chronic pain: Secondary | ICD-10-CM | POA: Diagnosis not present

## 2022-07-13 DIAGNOSIS — M25512 Pain in left shoulder: Secondary | ICD-10-CM

## 2022-07-13 DIAGNOSIS — M4722 Other spondylosis with radiculopathy, cervical region: Secondary | ICD-10-CM

## 2022-07-13 NOTE — Progress Notes (Signed)
Office Visit Note   Patient: Mindy Stevens           Date of Birth: [birth year]           MRN: [id number] Visit Date: 07/13/2022              Requested by: Dot Been, FNP [id number] [place],  [place] [address] PCP: Dot Been, FNP   Assessment & Plan: Visit Diagnoses:  1. Chronic left shoulder pain   2. Cervical spondylosis   3. Other spondylosis with radiculopathy, cervical region     Plan: Will proceed with some therapy and we discussed she does not have a full-thickness tear.  Physical therapy for her neck spondylosis and also left shoulder rotator cuff tendinopathy with partial tearing by MRI scan.  Recheck 2 months.  If her neck symptoms progress we will need to proceed with cervical spine imaging studies.  MRI images reviewed I gave her a copy of the report pathophysiology discussed.  Follow-Up Instructions: Return in about 2 months (around 09/12/2022).   Orders:  Orders Placed This Encounter  Procedures   Ambulatory referral to Physical Therapy   No orders of the defined types were placed in this encounter.     Procedures: No procedures performed   Clinical Data: No additional findings.   Subjective: Chief Complaint  Patient presents with   Left Shoulder - Pain, Follow-up    MRI review    HPI 67 year old female returns post subacromial injection 2 months ago on 04/26/2022.  Patient states the injection really has not given her relief she takes ibuprofen with some relief.  She has pain present all the time.  MRI scan has been obtained and is available for review.  This shows some calcific tendinopathy in the supraspinatus as seen on plain x-ray.  She has chronic tendinosis of the supraspinatus with partial tearing but no full-thickness tear and no muscle atrophy.  She also has moderate chondrosis of the glenohumeral joint with prominent inferior osteophytes.  Most of her pain is with outstretched and overhead activities.  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  Review of Systems positive for neck pain.  Previous x-rays 2023 showed multilevel spurring and straight cervical spine.  Patient has numbness that radiates to the left hand to the fingertips more radial and ulnar fingers constant and worse with activities when she turns her neck.  Number no myelopathic symptoms.   Objective: Vital Signs: BP 117/71   Pulse 90   Ht 5\' 2"  (1.575 m)   Wt 245 lb (111.1 kg)   BMI 44.81 kg/m   Physical Exam Constitutional:      Appearance: She is well-developed.  HENT:     Head: Normocephalic.     Right Ear: External ear normal.     Left Ear: External ear normal. There is no impacted cerumen.  Eyes:     Pupils: Pupils are equal, round, and reactive to light.  Neck:     Thyroid: No thyromegaly.     Trachea: No tracheal deviation.  Cardiovascular:     Rate and Rhythm: Normal rate.  Pulmonary:     Effort: Pulmonary effort is normal.  Abdominal:     Palpations: Abdomen is soft.  Musculoskeletal:     Cervical back: No rigidity.  Skin:    General: Skin is warm and dry.  Neurological:  Mental Status: She is alert and oriented to person, place, and time.  Psychiatric:        Behavior: Behavior normal.     Ortho Exam positive pendulum left shoulder long head of the biceps nontender positive Spurling on the left negative on the right.  Reflexes are 2+ and symmetrical.  Specialty Comments:  No specialty comments available.  Imaging: arrative & Impression  CLINICAL DATA:  chronic left shoulder pain, R/O supraspinatus tear   EXAM: MRI OF THE LEFT SHOULDER WITHOUT CONTRAST   TECHNIQUE: Multiplanar, multisequence MR imaging of the shoulder was performed. No intravenous contrast was administered.   COMPARISON:  Left shoulder radiograph 12/30/2021   FINDINGS: Rotator cuff: Moderate tendinosis of the distal  supraspinatus and infraspinatus tendons with articular and bursal sided fraying. Focal low signal intensity globular structure measuring 5 mm proximal to the footprint with correlate on prior radiograph compatible with calcific tendinosis (coronal T1 and T2 image 12). There is low-grade partial width bursal sided tearing at the tendon overlap near the footprint (coronal T2 image 14). Teres minor tendon is intact. Subscapularis tendon is intact.   Muscles: No significant muscle atrophy.   Biceps Long Head: Intraarticular and extraarticular portions of the biceps tendon are intact.   Acromioclavicular Joint: Mild arthropathy of the acromioclavicular joint. Mild subacromial/subdeltoid bursal fluid .   Glenohumeral Joint: Small joint effusion. Moderate chondrosis with inferior glenohumeral osteophyte formation.   Labrum: Degenerative posterior and superior labral fraying.   Bones: No evidence of acute fracture.  No aggressive osseous lesion.   Other: No focal fluid collection.   IMPRESSION: Moderate tendinosis of the distal supraspinatus and infraspinatus tendons with articular sided fraying and low-grade bursal sided tearing near the footprint, and focal calcification proximal to the footprint compatible with calcific tendinosis. No retracted cuff tear. No significant muscle atrophy.   Mild subacromial-subdeltoid bursitis.   Moderate glenohumeral osteoarthritis. Degenerative posterior and superior labral fraying.   Mild AC joint arthropathy.     Electronically Signed   By: Caprice Renshaw M.D.   On: 07/01/2022 15:23     PMFS History: Patient Active Problem List   Diagnosis Date Noted   Other spondylosis with radiculopathy, cervical region 07/13/2022   Impingement syndrome of left shoulder 04/26/2022   Dyspnea 12/19/2020   Past Medical History:  Diagnosis Date   GERD (gastroesophageal reflux disease)    Hyperlipidemia     Family History  Problem Relation Age of  Onset   Breast cancer Paternal Aunt     No past surgical history on file. Social History   Occupational History   Not on file  Tobacco Use   Smoking status: Former   Smokeless tobacco: Never  Building services engineer Use: Never used  Substance and Sexual Activity   Alcohol use: No   Drug use: No   Sexual activity: Never

## 2022-08-02 NOTE — Therapy (Signed)
OUTPATIENT PHYSICAL THERAPY CERVICAL EVALUATION   Patient Name: Mindy Stevens MRN: [id number] DOB:1955/03/01, 67 y.o., female Today's Date: 08/04/2022  END OF SESSION:  PT End of Session - 08/04/22 0927     Visit Number 1    Number of Visits 17    Date for PT Re-Evaluation 09/29/22    Authorization Type [employer]    Authorization Time Period no auth per appt notes    Progress Note Due on Visit 10    PT Start Time 0930    PT Stop Time 1016    PT Time Calculation (min) 46 min    Activity Tolerance Patient tolerated treatment well;Patient limited by pain    Behavior During Therapy WFL for tasks assessed/performed             Past Medical History:  Diagnosis Date   GERD (gastroesophageal reflux disease)    Hyperlipidemia    History reviewed. No pertinent surgical history. Patient Active Problem List   Diagnosis Date Noted   Other spondylosis with radiculopathy, cervical region 07/13/2022   Impingement syndrome of left shoulder 04/26/2022   Dyspnea 12/19/2020    PCP: Dot Been, FNP  REFERRING PROVIDER: Eldred Manges, MD  REFERRING DIAG: [phone] (ICD-10-CM) - Chronic left shoulder pain M47.812 (ICD-10-CM) - Cervical spondylosis  THERAPY DIAG:  Cervicalgia  Abnormal posture  Left shoulder pain, unspecified chronicity  Rationale for Evaluation and Treatment: Rehabilitation  ONSET DATE: 2-3 months for weakness, neck/shoulder pain for years   SUBJECTIVE:                                                                                                                                                                                                         SUBJECTIVE STATEMENT: Pt endorses history of difficulty with lifting items, difficulty washing and doing hair. Pt endorses chronic BIL shoulder pain, R used to be worse but responded well to an injection. Developed similar pain in L shoulder which she states has not really responded to injections. Symptoms  worsen with activity and prolonged positioning. Wakes up frequently throughout the night due to pain (~8-9x/night). States symptoms refer down LUE towards wrist, no N/T. Does describe some L hand weakness. Endorses chronic headaches in mornings that resolve after a bit of time/movement, non worsening over time.  Hand dominance: Right  PERTINENT HISTORY:  GERD, pt also endorses hx of fibromyalgia  PAIN:  Are you having pain: 7/10 Location/description: L anterior shoulder  Best-worst over past week: 7-10/10  - aggravating factors: standing/sitting 30-42min, lifting items, doing hair, self care -  Easing factors: medication, cervical sidebending  towards R  PRECAUTIONS: None  WEIGHT BEARING RESTRICTIONS: No  FALLS:  Has patient fallen in last 6 months? Yes. Number of falls 1; beginning of Spring this year, fell in her sisters yard, had some ankle pain but denies other injury, states she followed up with her doctor afterwards   LIVING ENVIRONMENT: Lives alone in apartment, no STE. Son and his girlfriend check in on her daily Pt states family helps with heavier activities around the apartment  OCCUPATION: not working currently  PLOF: Independent - family assist for heavier activities around the home  PATIENT GOALS: get back to praise dancing, very involved in church  NEXT MD VISIT: TBD   OBJECTIVE:   DIAGNOSTIC FINDINGS:  L shoulder MRI 07/01/22, below per EPIC: "IMPRESSION: Moderate tendinosis of the distal supraspinatus and infraspinatus tendons with articular sided fraying and low-grade bursal sided tearing near the footprint, and focal calcification proximal to the footprint compatible with calcific tendinosis. No retracted cuff tear. No significant muscle atrophy.   Mild subacromial-subdeltoid bursitis.   Moderate glenohumeral osteoarthritis. Degenerative posterior and superior labral fraying.   Mild AC joint arthropathy."  Cervical XR 11/26/21 "IMPRESSION: Moderate  multilevel degenerative changes of the cervical spine. No acute fracture or malalignment."  PATIENT SURVEYS:  FOTO 36 current, 57 predicted  COGNITION: Overall cognitive status: Within functional limits for tasks assessed  SENSATION: NT on eval  POSTURE: forward head, elevated B UT, rounded shoulders, holding head slightly towards R, LUE guarded  PALPATION: Deferred given time constraints   CERVICAL ROM:   Active ROM A/PROM (deg) eval  Flexion 100% no pain  Extension 75% painful in UE to elbow  Right lateral flexion 50% **  Left lateral flexion 75% *  Right rotation 52 deg  Left rotation 20 deg *   (Blank rows = not tested) (Key: WFL = within functional limits not formally assessed, * = concordant pain, s = stiffness/stretching sensation, NT = not tested)   UPPER EXTREMITY ROM:  Active ROM Right eval Left eval  Shoulder flexion 145 deg 68 deg *  Shoulder abduction 92 deg 48 deg *  Shoulder internal rotation (functional combo)  Greater trochanter *  Shoulder external rotation    Elbow flexion    Elbow extension    Wrist flexion    Wrist extension     (Blank rows = not tested) (Key: WFL = within functional limits not formally assessed, * = concordant pain, s = stiffness/stretching sensation, NT = not tested)  Comments:   UPPER EXTREMITY MMT:  MMT Right eval Left eval  Shoulder flexion    Shoulder extension    Shoulder abduction    Shoulder extension    Shoulder internal rotation    Shoulder external rotation    Elbow flexion    Elbow extension    Grip strength    (Blank rows = not tested)  (Key: WFL = within functional limits not formally assessed, * = concordant pain, s = stiffness/stretching sensation, NT = not tested)  Comments: deferred given symptom irritability  CERVICAL SPECIAL TESTS:  Deferred given symptom irritability although sidebending towards L and cervical extension do provoke LUE symptoms  FUNCTIONAL TESTS:  Overhead and IR functional  reach testing significantly limited on L compared to R  TODAY'S TREATMENT:  [facility] Adult PT Treatment:                                                DATE: 08/04/22 Therapeutic Exercise: Scap retraction x10 cues for comfortable ROM and posture LS towards R only 30sec, cues for comfortable ROM and breath control HEP handout + education, discussion re: modification based on symptom response  PATIENT EDUCATION:  Education details: Pt education on PT impairments, prognosis, and POC. Informed consent. Rationale for interventions, safe/appropriate HEP performance Person educated: Patient Education method: Explanation, Demonstration, Tactile cues, Verbal cues, and Handouts Education comprehension: verbalized understanding, returned demonstration, verbal cues required, tactile cues required, and needs further education    HOME EXERCISE PROGRAM: Access Code: [email or web address] URL: [email or web address] Date: 08/04/2022 Prepared by: Fransisco Hertz  Program Notes - levator scapula stretch to right only   Exercises - Seated Scapular Retraction  - 2-3 x daily - 7 x weekly - 1 sets - 8 reps - Gentle Levator Scapulae Stretch  - 2-3 x daily - 7 x weekly - 1 sets - 1-2 reps - 30sec hold  ASSESSMENT:  CLINICAL IMPRESSION: Pt is a very pleasant 67 year old woman who arrives to PT evaluation on this date for neck and L shoulder pain. Pt reports difficulty with lifting items, reaching with LUE, and self-care/household tasks due to pain. During today's session pt demonstrates limitations in cervical and GH mobility which are likely contributing to difficulty with aforementioned activities, LUE symptoms are reproduced by cervical extension and sidebending to L. Recommend skilled PT to address aforementioned deficits to improve functional independence/tolerance. No adverse events,  symptoms are fairly irritable on exam, some mild increase in symptoms on departure but tolerates HEP relatively well. Pt departs today's session in no acute distress, all voiced questions/concerns addressed appropriately from PT perspective.    OBJECTIVE IMPAIRMENTS: decreased activity tolerance, decreased endurance, decreased mobility, decreased ROM, decreased strength, impaired UE functional use, improper body mechanics, postural dysfunction, and pain.   ACTIVITY LIMITATIONS: carrying, lifting, sitting, standing, sleeping, dressing, reach over head, and hygiene/grooming  PARTICIPATION LIMITATIONS: meal prep, cleaning, laundry, driving, and community activity  PERSONAL FACTORS: Age and Time since onset of injury/illness/exacerbation are also affecting patient's functional outcome.   REHAB POTENTIAL: [place]  CLINICAL DECISION MAKING: Stable/uncomplicated  EVALUATION COMPLEXITY: Low   GOALS: Goals reviewed with patient? No  SHORT TERM GOALS: Target date: 09/01/2022 Pt will demonstrate appropriate understanding and performance of initially prescribed HEP in order to facilitate improved independence with management of symptoms.  Baseline: HEP provided on eval Goal status: INITIAL   2. Pt will score greater than or equal to 46 on FOTO in order to demonstrate improved perception of function due to symptoms.  Baseline: 36  Goal status: INITIAL    LONG TERM GOALS: Target date: 09/29/2022 Pt will score 57 on FOTO in order to demonstrate improved perception of function due to symptoms. Baseline: 36 Goal status: INITIAL  2. Pt will demonstrate at least 50 degrees of active cervical rotation ROM  bilaterally in order to demonstrate improved environmental awareness and safety with driving.  Baseline: see ROM chart above Goal status: INITIAL  3. Pt will report at least 50% decrease in overall pain levels in past week in order to facilitate improved tolerance to basic ADLs/mobility.   Baseline:  7-10/10  Goal status: INITIAL    4. Pt will  report/demonstrate ability to sit/stand for up to an hour with less than 2 point increase on NPS in order to demonstrate improved tolerance to community activity. Baseline: 7-10/10 pain with typical activities Goal status: INITIAL   5. Pt will endorse at least 50% improvement in frequency of waking due to pain in order to improve quality of life and overall health.  Baseline: waking 8-9x/night due to pain  Goal status: INITIAL   6. Pt will demonstrate at least 110deg of active L shoulder flexion ROM in order to facilitate improved functional reaching and participating in daily activities.   Baseline: see ROM chart above  Goal status: INITIAL    PLAN:  PT FREQUENCY: 2x/week  PT DURATION: 8 weeks  PLANNED INTERVENTIONS: Therapeutic exercises, Therapeutic activity, Neuromuscular re-education, Balance training, Gait training, Patient/Family education, Self Care, Joint mobilization, Aquatic Therapy, Dry Needling, Electrical stimulation, Spinal mobilization, Cryotherapy, Moist heat, Taping, Manual therapy, and Re-evaluation  PLAN FOR NEXT SESSION:  review/update HEP PRN. Look at sensation, grip strength, and palpation. Symptom modification strategies as appropriate/indicated. Work on cervical/GH mobility and periscapular activation within pt tolerance.    Ashley Murrain PT, DPT 08/04/2022 10:44 AM

## 2022-08-04 ENCOUNTER — Encounter: Payer: Self-pay | Admitting: Physical Therapy

## 2022-08-04 ENCOUNTER — Ambulatory Visit: Payer: 59 | Attending: Orthopaedic Surgery | Admitting: Physical Therapy

## 2022-08-04 ENCOUNTER — Other Ambulatory Visit: Payer: Self-pay

## 2022-08-04 DIAGNOSIS — M542 Cervicalgia: Secondary | ICD-10-CM

## 2022-08-04 DIAGNOSIS — M47812 Spondylosis without myelopathy or radiculopathy, cervical region: Secondary | ICD-10-CM | POA: Insufficient documentation

## 2022-08-04 DIAGNOSIS — M25512 Pain in left shoulder: Secondary | ICD-10-CM

## 2022-08-04 DIAGNOSIS — G8929 Other chronic pain: Secondary | ICD-10-CM | POA: Diagnosis not present

## 2022-08-04 DIAGNOSIS — R293 Abnormal posture: Secondary | ICD-10-CM | POA: Diagnosis present

## 2022-08-06 ENCOUNTER — Other Ambulatory Visit: Payer: Self-pay | Admitting: Sports Medicine

## 2022-08-11 ENCOUNTER — Ambulatory Visit: Payer: 59 | Admitting: Physical Therapy

## 2022-08-11 ENCOUNTER — Encounter: Payer: Self-pay | Admitting: Physical Therapy

## 2022-08-11 DIAGNOSIS — R293 Abnormal posture: Secondary | ICD-10-CM

## 2022-08-11 DIAGNOSIS — M542 Cervicalgia: Secondary | ICD-10-CM

## 2022-08-11 DIAGNOSIS — M25512 Pain in left shoulder: Secondary | ICD-10-CM

## 2022-08-11 NOTE — Therapy (Signed)
OUTPATIENT PHYSICAL THERAPY TREATMENT NOTE   Patient Name: Mindy Stevens MRN: [id number] DOB:1955-10-14, 67 y.o., female Today's Date: 08/11/2022  END OF SESSION:  PT End of Session - 08/11/22 1416     Visit Number 2    Number of Visits 17    Date for PT Re-Evaluation 09/29/22    Authorization Type [employer]    Authorization Time Period no auth per appt notes    Progress Note Due on Visit 10    PT Start Time 1416    PT Stop Time 1455    PT Time Calculation (min) 39 min    Activity Tolerance Patient limited by pain    Behavior During Therapy WFL for tasks assessed/performed              Past Medical History:  Diagnosis Date   GERD (gastroesophageal reflux disease)    Hyperlipidemia    History reviewed. No pertinent surgical history. Patient Active Problem List   Diagnosis Date Noted   Other spondylosis with radiculopathy, cervical region 07/13/2022   Impingement syndrome of left shoulder 04/26/2022   Dyspnea 12/19/2020    PCP: Dot Been, FNP  REFERRING PROVIDER: Eldred Manges, MD  REFERRING DIAG: [id number] (ICD-10-CM) - Chronic left shoulder pain M47.812 (ICD-10-CM) - Cervical spondylosis  THERAPY DIAG:  Cervicalgia  Abnormal posture  Left shoulder pain, unspecified chronicity  Rationale for Evaluation and Treatment: Rehabilitation  ONSET DATE: 2-3 months for weakness, neck/shoulder pain for years   SUBJECTIVE:                                                                                                                                                                                                        Per eval - Pt endorses history of difficulty with lifting items, difficulty washing and doing hair. Pt endorses chronic BIL shoulder pain, R used to be worse but responded well to an injection. Developed similar pain in L shoulder which she states has not really responded to injections. Symptoms worsen with activity and prolonged positioning.  Wakes up frequently throughout the night due to pain (~8-9x/night). States symptoms refer down LUE towards wrist, no N/T. Does describe some L hand weakness. Endorses chronic headaches in mornings that resolve after a bit of time/movement, non worsening over time.  Hand dominance: Right  SUBJECTIVE STATEMENT: 08/11/2022 Notes LS stretch has been painful, scapular retractions have been okay. Continues to report pain levels about the same overall.   PERTINENT HISTORY:  GERD, pt also endorses hx of fibromyalgia  PAIN:  Are you having pain: 8/10  Location/description: L anterior shoulder   Per eval -  Best-worst over past week: 7-10/10  - aggravating factors: standing/sitting 30-46min, lifting items, doing hair, self care - Easing factors: medication, cervical sidebending  towards R  PRECAUTIONS: None  WEIGHT BEARING RESTRICTIONS: No  FALLS:  Has patient fallen in last 6 months? Yes. Number of falls 1; beginning of Spring this year, fell in her sisters yard, had some ankle pain but denies other injury, states she followed up with her doctor afterwards   LIVING ENVIRONMENT: Lives alone in apartment, no STE. Son and his girlfriend check in on her daily Pt states family helps with heavier activities around the apartment  OCCUPATION: not working currently  PLOF: Independent - family assist for heavier activities around the home  PATIENT GOALS: get back to praise dancing, very involved in church  NEXT MD VISIT: TBD   OBJECTIVE: (objective measures completed at initial evaluation unless otherwise dated)   DIAGNOSTIC FINDINGS:  L shoulder MRI 07/01/22, below per EPIC: "IMPRESSION: Moderate tendinosis of the distal supraspinatus and infraspinatus tendons with articular sided fraying and low-grade bursal sided tearing near the footprint, and focal calcification proximal to the footprint compatible with calcific tendinosis. No retracted cuff tear. No significant muscle atrophy.   Mild  subacromial-subdeltoid bursitis.   Moderate glenohumeral osteoarthritis. Degenerative posterior and superior labral fraying.   Mild AC joint arthropathy."  Cervical XR 11/26/21 "IMPRESSION: Moderate multilevel degenerative changes of the cervical spine. No acute fracture or malalignment."  PATIENT SURVEYS:  FOTO 36 current, 57 predicted  COGNITION: Overall cognitive status: Within functional limits for tasks assessed  SENSATION: NT on eval 08/11/22: Light touch intact B UE aside from mild reduction C6 on L side. Negative hoffmans/tromner signs BIL UE   POSTURE: forward head, elevated B UT, rounded shoulders, holding head slightly towards R, LUE guarded  PALPATION: Deferred given time constraints  08/11/22: concordant tenderness/pain with L LS/UT, rhomboids, trigger point in infraspinatus. Reproduction of UE symptoms with trigger points in infraspinatus and levator scap  CERVICAL ROM:   Active ROM A/PROM (deg) eval  Flexion 100% no pain  Extension 75% painful in UE to elbow  Right lateral flexion 50% **  Left lateral flexion 75% *  Right rotation 52 deg  Left rotation 20 deg *   (Blank rows = not tested) (Key: WFL = within functional limits not formally assessed, * = concordant pain, s = stiffness/stretching sensation, NT = not tested)   UPPER EXTREMITY ROM:  Active ROM Right eval Left eval  Shoulder flexion 145 deg 68 deg *  Shoulder abduction 92 deg 48 deg *  Shoulder internal rotation (functional combo)  Greater trochanter *  Shoulder external rotation    Elbow flexion    Elbow extension    Wrist flexion    Wrist extension     (Blank rows = not tested) (Key: WFL = within functional limits not formally assessed, * = concordant pain, s = stiffness/stretching sensation, NT = not tested)  Comments:   UPPER EXTREMITY MMT:  MMT Right eval Left eval  Shoulder flexion    Shoulder extension    Shoulder abduction    Shoulder extension    Shoulder internal  rotation    Shoulder external rotation    Elbow flexion    Elbow extension    Grip strength    (Blank rows = not tested)  (Key: WFL = within functional limits not formally assessed, * = concordant pain, s = stiffness/stretching sensation, NT = not tested)  Comments: deferred given symptom irritability  CERVICAL SPECIAL TESTS:  Deferred given symptom irritability although sidebending towards L and cervical extension do provoke LUE symptoms  FUNCTIONAL TESTS:  Overhead and IR functional reach testing significantly limited on L compared to R  TODAY'S TREATMENT:                                                                                                                              [facility] Adult PT Treatment:                                                DATE: 08/11/22 Therapeutic Exercise: Scapular retraction 2x10 cues for posture and arm positioning Min ROM radial nerve (wrist supination/pronation in elbow flexed position) x10 cues for form, appropriate ROM Min ROM medial nerve (wrist ext/flex in elbow flexed position) x5 (discontinued due to discomfort) Chin tucks, seated, x8 cues for form Swiss ball GH flexion at table, x10, x5 cues for comfortable ROM HEP update + education  Manual Therapy: Gentle STM L LS, UT, rhomboid, infraspinatus. Trigger point release (minimal pressure LS); seated  [facility] Adult PT Treatment:                                                DATE: 08/04/22 Therapeutic Exercise: Scap retraction x10 cues for comfortable ROM and posture LS towards R only 30sec, cues for comfortable ROM and breath control HEP handout + education, discussion re: modification based on symptom response  PATIENT EDUCATION:  Education details: rationale for interventions, relevant anatomy/physiology, HEP  Person educated: Patient Education method: Explanation, Demonstration, Tactile cues, Verbal cues, and Handouts Education comprehension: verbalized understanding, returned demonstration,  verbal cues required, tactile cues required, and needs further education    HOME EXERCISE PROGRAM: Access Code: [email or web address] URL: [email or web address] Date: 08/11/2022 Prepared by: Fransisco Hertz  Exercises - Seated Scapular Retraction  - 2-3 x daily - 7 x weekly - 1 sets - 8 reps - Seated Shoulder Flexion Towel Slide at Table Top  - 2-3 x daily - 7 x weekly - 1 sets - 8 reps  ASSESSMENT:  CLINICAL IMPRESSION: 08/11/2022 Pt arrives w/ 8/10 pain, continues to endorse pain about the same overall, does note some increased difficulty/pain with LS stretching. Today pt displays high symptom irritability with majority of movements, with shoulder symptoms provoked by both cervical and UE movements. Least irritable movements are GH flexion assisted with swiss ball and scapular retraction. Trial of both median and nerve glides w/ very limited ROM given symptom irritability, some initial improvement with median nerve glides although increases pain about a minute after performance, and poor tolerance to radial nerve glides which are discontinued. Trial of gentle  STM to periscapular/cervical musculature, able to reproduce UE symptoms with palpation of trigger points but no overt change in symptoms. Pt denies any significant change in symptoms on departure, no adverse events,HEP update as above. At this point, recommend continuing along current POC in order to address relevant deficits and improve functional tolerance. Pt departs today's session in no acute distress, all voiced questions/concerns addressed appropriately from PT perspective.     Per eval - Pt is a very pleasant 67 year old woman who arrives to PT evaluation on this date for neck and L shoulder pain. Pt reports difficulty with lifting items, reaching with LUE, and self-care/household tasks due to pain. During today's session pt demonstrates limitations in cervical and GH mobility which are likely contributing to difficulty with  aforementioned activities, LUE symptoms are reproduced by cervical extension and sidebending to L. Recommend skilled PT to address aforementioned deficits to improve functional independence/tolerance. No adverse events, symptoms are fairly irritable on exam, some mild increase in symptoms on departure but tolerates HEP relatively well. Pt departs today's session in no acute distress, all voiced questions/concerns addressed appropriately from PT perspective.    OBJECTIVE IMPAIRMENTS: decreased activity tolerance, decreased endurance, decreased mobility, decreased ROM, decreased strength, impaired UE functional use, improper body mechanics, postural dysfunction, and pain.   ACTIVITY LIMITATIONS: carrying, lifting, sitting, standing, sleeping, dressing, reach over head, and hygiene/grooming  PARTICIPATION LIMITATIONS: meal prep, cleaning, laundry, driving, and community activity  PERSONAL FACTORS: Age and Time since onset of injury/illness/exacerbation are also affecting patient's functional outcome.   REHAB POTENTIAL: [facility]  CLINICAL DECISION MAKING: Stable/uncomplicated  EVALUATION COMPLEXITY: Low   GOALS: Goals reviewed with patient? No  SHORT TERM GOALS: Target date: 09/01/2022 Pt will demonstrate appropriate understanding and performance of initially prescribed HEP in order to facilitate improved independence with management of symptoms.  Baseline: HEP provided on eval Goal status: INITIAL   2. Pt will score greater than or equal to 46 on FOTO in order to demonstrate improved perception of function due to symptoms.  Baseline: 36  Goal status: INITIAL    LONG TERM GOALS: Target date: 09/29/2022 Pt will score 57 on FOTO in order to demonstrate improved perception of function due to symptoms. Baseline: 36 Goal status: INITIAL  2. Pt will demonstrate at least 50 degrees of active cervical rotation ROM  bilaterally in order to demonstrate improved environmental awareness and safety with  driving.  Baseline: see ROM chart above Goal status: INITIAL  3. Pt will report at least 50% decrease in overall pain levels in past week in order to facilitate improved tolerance to basic ADLs/mobility.   Baseline: 7-10/10  Goal status: INITIAL    4. Pt will report/demonstrate ability to sit/stand for up to an hour with less than 2 point increase on NPS in order to demonstrate improved tolerance to community activity. Baseline: 7-10/10 pain with typical activities Goal status: INITIAL   5. Pt will endorse at least 50% improvement in frequency of waking due to pain in order to improve quality of life and overall health.  Baseline: waking 8-9x/night due to pain  Goal status: INITIAL   6. Pt will demonstrate at least 110deg of active L shoulder flexion ROM in order to facilitate improved functional reaching and participating in daily activities.   Baseline: see ROM chart above  Goal status: INITIAL    PLAN:  PT FREQUENCY: 2x/week  PT DURATION: 8 weeks  PLANNED INTERVENTIONS: Therapeutic exercises, Therapeutic activity, Neuromuscular re-education, Balance training, Gait training, Patient/Family education,  Self Care, Joint mobilization, Aquatic Therapy, Dry Needling, Electrical stimulation, Spinal mobilization, Cryotherapy, Moist heat, Taping, Manual therapy, and Re-evaluation  PLAN FOR NEXT SESSION:  review/update HEP PRN. Symptom modification strategies as appropriate/indicated. Work on cervical/GH mobility and periscapular activation within pt tolerance.     Ashley Murrain PT, DPT 08/11/2022 5:46 PM

## 2022-08-13 ENCOUNTER — Other Ambulatory Visit: Payer: Self-pay | Admitting: Radiology

## 2022-08-13 MED ORDER — MELOXICAM 15 MG PO TABS: 15.0000 mg | ORAL_TABLET | Freq: Every day | ORAL | 1 refills | Status: AC

## 2022-08-24 ENCOUNTER — Ambulatory Visit: Payer: 59 | Attending: Family Medicine | Admitting: Physical Therapy

## 2022-08-24 ENCOUNTER — Encounter: Payer: Self-pay | Admitting: Physical Therapy

## 2022-08-24 DIAGNOSIS — M542 Cervicalgia: Secondary | ICD-10-CM | POA: Insufficient documentation

## 2022-08-24 DIAGNOSIS — R293 Abnormal posture: Secondary | ICD-10-CM | POA: Diagnosis present

## 2022-08-24 DIAGNOSIS — M25512 Pain in left shoulder: Secondary | ICD-10-CM | POA: Insufficient documentation

## 2022-08-24 NOTE — Therapy (Signed)
OUTPATIENT PHYSICAL THERAPY TREATMENT NOTE   Patient Name: Mindy Stevens MRN: [id number] DOB:Jan 12, 1956, 67 y.o., female Today's Date: 08/24/2022  END OF SESSION:  PT End of Session - 08/24/22 1401     Visit Number 3    Number of Visits 17    Date for PT Re-Evaluation 09/29/22    Authorization Type [employer]    Authorization Time Period no auth per appt notes    Progress Note Due on Visit 10    PT Start Time 1401    PT Stop Time 1440    PT Time Calculation (min) 39 min    Activity Tolerance Patient tolerated treatment well;No increased pain    Behavior During Therapy WFL for tasks assessed/performed               Past Medical History:  Diagnosis Date   GERD (gastroesophageal reflux disease)    Hyperlipidemia    History reviewed. No pertinent surgical history. Patient Active Problem List   Diagnosis Date Noted   Other spondylosis with radiculopathy, cervical region 07/13/2022   Impingement syndrome of left shoulder 04/26/2022   Dyspnea 12/19/2020    PCP: Dot Been, FNP  REFERRING PROVIDER: Eldred Manges, MD  REFERRING DIAG: [phone] (ICD-10-CM) - Chronic left shoulder pain M47.812 (ICD-10-CM) - Cervical spondylosis  THERAPY DIAG:  Cervicalgia  Abnormal posture  Left shoulder pain, unspecified chronicity  Rationale for Evaluation and Treatment: Rehabilitation  ONSET DATE: 2-3 months for weakness, neck/shoulder pain for years   SUBJECTIVE:                                                                                                                                                                                                        Per eval - Pt endorses history of difficulty with lifting items, difficulty washing and doing hair. Pt endorses chronic BIL shoulder pain, R used to be worse but responded well to an injection. Developed similar pain in L shoulder which she states has not really responded to injections. Symptoms worsen with activity and  prolonged positioning. Wakes up frequently throughout the night due to pain (~8-9x/night). States symptoms refer down LUE towards wrist, no N/T. Does describe some L hand weakness. Endorses chronic headaches in mornings that resolve after a bit of time/movement, non worsening over time.  Hand dominance: Right  SUBJECTIVE STATEMENT: 08/24/2022 Pt continues to endorse consistent soreness and LUE pain. States she felt some increased pain after last session, back to baseline by next day. States she has been getting some relief with L hand strengthening. Reports Makin  HEP adherence, no significant changes.    PERTINENT HISTORY:  GERD, pt also endorses hx of fibromyalgia  PAIN:  Are you having pain: 10/10 Location/description: L anterior shoulder   Per eval -  Best-worst over past week: 7-10/10  - aggravating factors: standing/sitting 30-15min, lifting items, doing hair, self care - Easing factors: medication, cervical sidebending  towards R  PRECAUTIONS: None  WEIGHT BEARING RESTRICTIONS: No  FALLS:  Has patient fallen in last 6 months? Yes. Number of falls 1; beginning of Spring this year, fell in her sisters yard, had some ankle pain but denies other injury, states she followed up with her doctor afterwards   LIVING ENVIRONMENT: Lives alone in apartment, no STE. Son and his girlfriend check in on her daily Pt states family helps with heavier activities around the apartment  OCCUPATION: not working currently  PLOF: Independent - family assist for heavier activities around the home  PATIENT GOALS: get back to praise dancing, very involved in church  NEXT MD VISIT: TBD   OBJECTIVE: (objective measures completed at initial evaluation unless otherwise dated)   DIAGNOSTIC FINDINGS:  L shoulder MRI 07/01/22, below per EPIC: "IMPRESSION: Moderate tendinosis of the distal supraspinatus and infraspinatus tendons with articular sided fraying and low-grade bursal sided tearing near the  footprint, and focal calcification proximal to the footprint compatible with calcific tendinosis. No retracted cuff tear. No significant muscle atrophy.   Mild subacromial-subdeltoid bursitis.   Moderate glenohumeral osteoarthritis. Degenerative posterior and superior labral fraying.   Mild AC joint arthropathy."  Cervical XR 11/26/21 "IMPRESSION: Moderate multilevel degenerative changes of the cervical spine. No acute fracture or malalignment."  PATIENT SURVEYS:  FOTO 36 current, 57 predicted  COGNITION: Overall cognitive status: Within functional limits for tasks assessed  SENSATION: NT on eval 08/11/22: Light touch intact B UE aside from mild reduction C6 on L side. Negative hoffmans/tromner signs BIL UE   POSTURE: forward head, elevated B UT, rounded shoulders, holding head slightly towards R, LUE guarded  PALPATION: Deferred given time constraints  08/11/22: concordant tenderness/pain with L LS/UT, rhomboids, trigger point in infraspinatus. Reproduction of UE symptoms with trigger points in infraspinatus and levator scap  CERVICAL ROM:   Active ROM A/PROM (deg) eval  Flexion 100% no pain  Extension 75% painful in UE to elbow  Right lateral flexion 50% **  Left lateral flexion 75% *  Right rotation 52 deg  Left rotation 20 deg *   (Blank rows = not tested) (Key: WFL = within functional limits not formally assessed, * = concordant pain, s = stiffness/stretching sensation, NT = not tested)   UPPER EXTREMITY ROM:  Active ROM Right eval Left eval  Shoulder flexion 145 deg 68 deg *  Shoulder abduction 92 deg 48 deg *  Shoulder internal rotation (functional combo)  Greater trochanter *  Shoulder external rotation    Elbow flexion    Elbow extension    Wrist flexion    Wrist extension     (Blank rows = not tested) (Key: WFL = within functional limits not formally assessed, * = concordant pain, s = stiffness/stretching sensation, NT = not tested)  Comments:    UPPER EXTREMITY MMT:  MMT Right eval Left eval  Shoulder flexion    Shoulder extension    Shoulder abduction    Shoulder extension    Shoulder internal rotation    Shoulder external rotation    Elbow flexion    Elbow extension    Grip strength    (Blank  rows = not tested)  (Key: WFL = within functional limits not formally assessed, * = concordant pain, s = stiffness/stretching sensation, NT = not tested)  Comments: deferred given symptom irritability  CERVICAL SPECIAL TESTS:  Deferred given symptom irritability although sidebending towards L and cervical extension do provoke LUE symptoms  FUNCTIONAL TESTS:  Overhead and IR functional reach testing significantly limited on L compared to R  TODAY'S TREATMENT:                                                                                                                              [facility] Adult PT Treatment:                                                DATE: 08/24/22 Therapeutic Exercise: Scapular retraction 2x10 cues for elbow positioning  Chin tuck x8, seated, cues for reduced ROM/compensations  Standing swiss ball rollout for GH flexion x15 cues for comfortable ROM Standing swiss ball press down 2x8 cues for posture and comfortable force output  Manual Therapy: Gentle STM L LS, cervical paraspinals, rhomboids, deltoid, infraspinatus, teres minor, biceps; seated w/ open palm technique for reduced concentration of pressure, gentle oscillations with low amplitude for reduced muscular guarding/tension    [facility] Adult PT Treatment:                                                DATE: 08/11/22 Therapeutic Exercise: Scapular retraction 2x10 cues for posture and arm positioning Min ROM radial nerve (wrist supination/pronation in elbow flexed position) x10 cues for form, appropriate ROM Min ROM medial nerve (wrist ext/flex in elbow flexed position) x5 (discontinued due to discomfort) Chin tucks, seated, x8 cues for form Swiss ball GH  flexion at table, x10, x5 cues for comfortable ROM HEP update + education  Manual Therapy: Gentle STM L LS, UT, rhomboid, infraspinatus. Trigger point release (minimal pressure LS); seated  [facility] Adult PT Treatment:                                                DATE: 08/04/22 Therapeutic Exercise: Scap retraction x10 cues for comfortable ROM and posture LS towards R only 30sec, cues for comfortable ROM and breath control HEP handout + education, discussion re: modification based on symptom response  PATIENT EDUCATION:  Education details: rationale for interventions, relevant anatomy/physiology, HEP  Person educated: Patient Education method: Explanation, Demonstration, Tactile cues, Verbal cues, and Handouts Education comprehension: verbalized understanding, returned demonstration, verbal cues required, tactile cues required, and needs further education  HOME EXERCISE PROGRAM: Access Code: [email or web address] URL: [email or web address] Date: 08/11/2022 Prepared by: Fransisco Hertz  Exercises - Seated Scapular Retraction  - 2-3 x daily - 7 x weekly - 1 sets - 8 reps - Seated Shoulder Flexion Towel Slide at Table Top  - 2-3 x daily - 7 x weekly - 1 sets - 8 reps  ASSESSMENT:  CLINICAL IMPRESSION: 08/24/2022 Pt arrives w/ 10/10 pain, improves to 7/10 post manual (as above).  Also notes improved exercise tolerance after manual compared to previous sessions with resolution of LUE tingling and maintaining 7/10 pain throughout. No adverse events, improved tolerance overall compared to last session and initial eval. Recommend continuing along current POC in order to address relevant deficits and improve functional tolerance. Pt departs today's session in no acute distress, all voiced questions/concerns addressed appropriately from PT perspective.      Per eval - Pt is a very pleasant 67 year old woman who arrives to PT evaluation on this date for neck and L shoulder pain. Pt reports  difficulty with lifting items, reaching with LUE, and self-care/household tasks due to pain. During today's session pt demonstrates limitations in cervical and GH mobility which are likely contributing to difficulty with aforementioned activities, LUE symptoms are reproduced by cervical extension and sidebending to L. Recommend skilled PT to address aforementioned deficits to improve functional independence/tolerance. No adverse events, symptoms are fairly irritable on exam, some mild increase in symptoms on departure but tolerates HEP relatively well. Pt departs today's session in no acute distress, all voiced questions/concerns addressed appropriately from PT perspective.    OBJECTIVE IMPAIRMENTS: decreased activity tolerance, decreased endurance, decreased mobility, decreased ROM, decreased strength, impaired UE functional use, improper body mechanics, postural dysfunction, and pain.   ACTIVITY LIMITATIONS: carrying, lifting, sitting, standing, sleeping, dressing, reach over head, and hygiene/grooming  PARTICIPATION LIMITATIONS: meal prep, cleaning, laundry, driving, and community activity  PERSONAL FACTORS: Age and Time since onset of injury/illness/exacerbation are also affecting patient's functional outcome.   REHAB POTENTIAL: [place]  CLINICAL DECISION MAKING: Stable/uncomplicated  EVALUATION COMPLEXITY: Low   GOALS: Goals reviewed with patient? No  SHORT TERM GOALS: Target date: 09/01/2022 Pt will demonstrate appropriate understanding and performance of initially prescribed HEP in order to facilitate improved independence with management of symptoms.  Baseline: HEP provided on eval Goal status: INITIAL   2. Pt will score greater than or equal to 46 on FOTO in order to demonstrate improved perception of function due to symptoms.  Baseline: 36  Goal status: INITIAL    LONG TERM GOALS: Target date: 09/29/2022 Pt will score 57 on FOTO in order to demonstrate improved perception of  function due to symptoms. Baseline: 36 Goal status: INITIAL  2. Pt will demonstrate at least 50 degrees of active cervical rotation ROM  bilaterally in order to demonstrate improved environmental awareness and safety with driving.  Baseline: see ROM chart above Goal status: INITIAL  3. Pt will report at least 50% decrease in overall pain levels in past week in order to facilitate improved tolerance to basic ADLs/mobility.   Baseline: 7-10/10  Goal status: INITIAL    4. Pt will report/demonstrate ability to sit/stand for up to an hour with less than 2 point increase on NPS in order to demonstrate improved tolerance to community activity. Baseline: 7-10/10 pain with typical activities Goal status: INITIAL   5. Pt will endorse at least 50% improvement in frequency of waking due to pain in order to improve quality of life and overall health.  Baseline:  waking 8-9x/night due to pain  Goal status: INITIAL   6. Pt will demonstrate at least 110deg of active L shoulder flexion ROM in order to facilitate improved functional reaching and participating in daily activities.   Baseline: see ROM chart above  Goal status: INITIAL    PLAN:  PT FREQUENCY: 2x/week  PT DURATION: 8 weeks  PLANNED INTERVENTIONS: Therapeutic exercises, Therapeutic activity, Neuromuscular re-education, Balance training, Gait training, Patient/Family education, Self Care, Joint mobilization, Aquatic Therapy, Dry Needling, Electrical stimulation, Spinal mobilization, Cryotherapy, Moist heat, Taping, Manual therapy, and Re-evaluation  PLAN FOR NEXT SESSION:  review/update HEP PRN. Symptom modification strategies as appropriate/indicated. Work on cervical/GH mobility and periscapular activation within pt tolerance.     Ashley Murrain PT, DPT 08/24/2022 2:42 PM

## 2022-08-26 ENCOUNTER — Ambulatory Visit: Payer: 59 | Admitting: Physical Therapy

## 2022-08-26 ENCOUNTER — Encounter: Payer: Self-pay | Admitting: Physical Therapy

## 2022-08-26 DIAGNOSIS — M542 Cervicalgia: Secondary | ICD-10-CM

## 2022-08-26 DIAGNOSIS — M25512 Pain in left shoulder: Secondary | ICD-10-CM

## 2022-08-26 DIAGNOSIS — R293 Abnormal posture: Secondary | ICD-10-CM

## 2022-08-26 NOTE — Therapy (Signed)
OUTPATIENT PHYSICAL THERAPY TREATMENT NOTE   Patient Name: Mindy Stevens MRN: [id number] DOB:1955/10/12, 67 y.o., female Today's Date: 08/26/2022  END OF SESSION:  PT End of Session - 08/26/22 1019     Visit Number 4    Number of Visits 17    Date for PT Re-Evaluation 09/29/22    Authorization Type [employer]    Authorization Time Period no auth per appt notes    Progress Note Due on Visit 10    PT Start Time 1019    PT Stop Time 1055    PT Time Calculation (min) 36 min    Activity Tolerance Patient tolerated treatment well;No increased pain    Behavior During Therapy WFL for tasks assessed/performed                Past Medical History:  Diagnosis Date   GERD (gastroesophageal reflux disease)    Hyperlipidemia    History reviewed. No pertinent surgical history. Patient Active Problem List   Diagnosis Date Noted   Other spondylosis with radiculopathy, cervical region 07/13/2022   Impingement syndrome of left shoulder 04/26/2022   Dyspnea 12/19/2020    PCP: Dot Been, FNP  REFERRING PROVIDER: Eldred Manges, MD  REFERRING DIAG: [phone] (ICD-10-CM) - Chronic left shoulder pain M47.812 (ICD-10-CM) - Cervical spondylosis  THERAPY DIAG:  Cervicalgia  Abnormal posture  Left shoulder pain, unspecified chronicity  Rationale for Evaluation and Treatment: Rehabilitation  ONSET DATE: 2-3 months for weakness, neck/shoulder pain for years   SUBJECTIVE:                                                                                                                                                                                                        Per eval - Pt endorses history of difficulty with lifting items, difficulty washing and doing hair. Pt endorses chronic BIL shoulder pain, R used to be worse but responded well to an injection. Developed similar pain in L shoulder which she states has not really responded to injections. Symptoms worsen with activity  and prolonged positioning. Wakes up frequently throughout the night due to pain (~8-9x/night). States symptoms refer down LUE towards wrist, no N/T. Does describe some L hand weakness. Endorses chronic headaches in mornings that resolve after a bit of time/movement, non worsening over time.  Hand dominance: Right  SUBJECTIVE STATEMENT: 08/26/2022 Pt states she felt pretty Carbary after last session, got some Lallier relief up until this morning. States she thinks she slept wrong, woke up with more pain currently at 10/10.    PERTINENT HISTORY:  GERD, pt also endorses hx of fibromyalgia  PAIN:  Are you having pain: 10/10  Location/description: L anterior shoulder   Per eval -  Best-worst over past week: 7-10/10  - aggravating factors: standing/sitting 30-3min, lifting items, doing hair, self care - Easing factors: medication, cervical sidebending  towards R  PRECAUTIONS: None  WEIGHT BEARING RESTRICTIONS: No  FALLS:  Has patient fallen in last 6 months? Yes. Number of falls 1; beginning of Spring this year, fell in her sisters yard, had some ankle pain but denies other injury, states she followed up with her doctor afterwards   LIVING ENVIRONMENT: Lives alone in apartment, no STE. Son and his girlfriend check in on her daily Pt states family helps with heavier activities around the apartment  OCCUPATION: not working currently  PLOF: Independent - family assist for heavier activities around the home  PATIENT GOALS: get back to praise dancing, very involved in church  NEXT MD VISIT: TBD   OBJECTIVE: (objective measures completed at initial evaluation unless otherwise dated)   DIAGNOSTIC FINDINGS:  L shoulder MRI 07/01/22, below per EPIC: "IMPRESSION: Moderate tendinosis of the distal supraspinatus and infraspinatus tendons with articular sided fraying and low-grade bursal sided tearing near the footprint, and focal calcification proximal to the footprint compatible with calcific  tendinosis. No retracted cuff tear. No significant muscle atrophy.   Mild subacromial-subdeltoid bursitis.   Moderate glenohumeral osteoarthritis. Degenerative posterior and superior labral fraying.   Mild AC joint arthropathy."  Cervical XR 11/26/21 "IMPRESSION: Moderate multilevel degenerative changes of the cervical spine. No acute fracture or malalignment."  PATIENT SURVEYS:  FOTO 36 current, 57 predicted  COGNITION: Overall cognitive status: Within functional limits for tasks assessed  SENSATION: NT on eval 08/11/22: Light touch intact B UE aside from mild reduction C6 on L side. Negative hoffmans/tromner signs BIL UE   POSTURE: forward head, elevated B UT, rounded shoulders, holding head slightly towards R, LUE guarded  PALPATION: Deferred given time constraints  08/11/22: concordant tenderness/pain with L LS/UT, rhomboids, trigger point in infraspinatus. Reproduction of UE symptoms with trigger points in infraspinatus and levator scap  CERVICAL ROM:   Active ROM A/PROM (deg) eval  Flexion 100% no pain  Extension 75% painful in UE to elbow  Right lateral flexion 50% **  Left lateral flexion 75% *  Right rotation 52 deg  Left rotation 20 deg *   (Blank rows = not tested) (Key: WFL = within functional limits not formally assessed, * = concordant pain, s = stiffness/stretching sensation, NT = not tested)   UPPER EXTREMITY ROM:  Active ROM Right eval Left eval  Shoulder flexion 145 deg 68 deg *  Shoulder abduction 92 deg 48 deg *  Shoulder internal rotation (functional combo)  Greater trochanter *  Shoulder external rotation    Elbow flexion    Elbow extension    Wrist flexion    Wrist extension     (Blank rows = not tested) (Key: WFL = within functional limits not formally assessed, * = concordant pain, s = stiffness/stretching sensation, NT = not tested)  Comments:   UPPER EXTREMITY MMT:  MMT Right eval Left eval  Shoulder flexion    Shoulder  extension    Shoulder abduction    Shoulder extension    Shoulder internal rotation    Shoulder external rotation    Elbow flexion    Elbow extension    Grip strength    (Blank rows = not tested)  (Key: WFL = within functional  limits not formally assessed, * = concordant pain, s = stiffness/stretching sensation, NT = not tested)  Comments: deferred given symptom irritability  CERVICAL SPECIAL TESTS:  Deferred given symptom irritability although sidebending towards L and cervical extension do provoke LUE symptoms  FUNCTIONAL TESTS:  Overhead and IR functional reach testing significantly limited on L compared to R  TODAY'S TREATMENT:                                                                                                                              [facility] Adult PT Treatment:                                                DATE: 08/26/22 Therapeutic Exercise: Scapular retraction 3x10 cues for posture Shoulder flexion iso 2x6 at doorway, cues for setup and appropriate force output, monitoring symptoms Chin tuck x10 cues for posture  HEP update + education on appropriate performance  Manual Therapy: Gentle STM L LS, cervical paraspinals, rhomboids, deltoid, infraspinatus, teres minor, biceps; seated w/ open palm technique for reduced concentration of pressure, gentle oscillations with low amplitude for reduced muscular guarding/tension. Increased time spent with trigger points in infraspinatus   [facility] Adult PT Treatment:                                                DATE: 08/24/22 Therapeutic Exercise: Scapular retraction 2x10 cues for elbow positioning  Chin tuck x8, seated, cues for reduced ROM/compensations  Standing swiss ball rollout for GH flexion x15 cues for comfortable ROM Standing swiss ball press down 2x8 cues for posture and comfortable force output  Manual Therapy: Gentle STM L LS, cervical paraspinals, rhomboids, deltoid, infraspinatus, teres minor, biceps; seated w/  open palm technique for reduced concentration of pressure, gentle oscillations with low amplitude for reduced muscular guarding/tension    [facility] Adult PT Treatment:                                                DATE: 08/11/22 Therapeutic Exercise: Scapular retraction 2x10 cues for posture and arm positioning Min ROM radial nerve (wrist supination/pronation in elbow flexed position) x10 cues for form, appropriate ROM Min ROM medial nerve (wrist ext/flex in elbow flexed position) x5 (discontinued due to discomfort) Chin tucks, seated, x8 cues for form Swiss ball GH flexion at table, x10, x5 cues for comfortable ROM HEP update + education  Manual Therapy: Gentle STM L LS, UT, rhomboid, infraspinatus. Trigger point release (minimal pressure LS); seated  [facility] Adult PT Treatment:  DATE: 08/04/22 Therapeutic Exercise: Scap retraction x10 cues for comfortable ROM and posture LS towards R only 30sec, cues for comfortable ROM and breath control HEP handout + education, discussion re: modification based on symptom response  PATIENT EDUCATION:  Education details: rationale for interventions, relevant anatomy/physiology, HEP  Person educated: Patient Education method: Explanation, Demonstration, Tactile cues, Verbal cues, and Handouts Education comprehension: verbalized understanding, returned demonstration, verbal cues required, tactile cues required, and needs further education    HOME EXERCISE PROGRAM: Access Code: [email or web address] URL: [email or web address] Date: 08/26/2022 Prepared by: Fransisco Hertz  Exercises - Seated Scapular Retraction  - 2-3 x daily - 7 x weekly - 1 sets - 8 reps - Seated Shoulder Flexion Towel Slide at Table Top  - 2-3 x daily - 7 x weekly - 1 sets - 8 reps - Standing Isometric Shoulder Flexion with Doorway - Arm Bent  - 2-3 x daily - 7 x weekly - 1 sets - 6 reps  ASSESSMENT:  CLINICAL IMPRESSION: 08/26/2022 Pt  arrives with 10/10 pain although she states she felt Brammer relief after last session until this morning. Continuing to initiate with manual which reduces pain to 7/10, concordant LUE tingling reproduced with trigger points in L infraspinatus, resolves with manual. Continuing to progress GH/cervical stability exercises for increased volume and introduction of shoulder flexor isometrics - pt describes some initial anterior shoulder discomfort with majority of activities but improves with repetition. Overall tolerance to therapy does seem to be slowly improving. Departs with report of 7/10 pain and resolution of distal LUE symptoms, no adverse events. Pt departs today's session in no acute distress, all voiced questions/concerns addressed appropriately from PT perspective.     Per eval - Pt is a very pleasant 67 year old woman who arrives to PT evaluation on this date for neck and L shoulder pain. Pt reports difficulty with lifting items, reaching with LUE, and self-care/household tasks due to pain. During today's session pt demonstrates limitations in cervical and GH mobility which are likely contributing to difficulty with aforementioned activities, LUE symptoms are reproduced by cervical extension and sidebending to L. Recommend skilled PT to address aforementioned deficits to improve functional independence/tolerance. No adverse events, symptoms are fairly irritable on exam, some mild increase in symptoms on departure but tolerates HEP relatively well. Pt departs today's session in no acute distress, all voiced questions/concerns addressed appropriately from PT perspective.    OBJECTIVE IMPAIRMENTS: decreased activity tolerance, decreased endurance, decreased mobility, decreased ROM, decreased strength, impaired UE functional use, improper body mechanics, postural dysfunction, and pain.   ACTIVITY LIMITATIONS: carrying, lifting, sitting, standing, sleeping, dressing, reach over head, and  hygiene/grooming  PARTICIPATION LIMITATIONS: meal prep, cleaning, laundry, driving, and community activity  PERSONAL FACTORS: Age and Time since onset of injury/illness/exacerbation are also affecting patient's functional outcome.   REHAB POTENTIAL: [place]  CLINICAL DECISION MAKING: Stable/uncomplicated  EVALUATION COMPLEXITY: Low   GOALS: Goals reviewed with patient? No  SHORT TERM GOALS: Target date: 09/01/2022 Pt will demonstrate appropriate understanding and performance of initially prescribed HEP in order to facilitate improved independence with management of symptoms.  Baseline: HEP provided on eval Goal status: INITIAL   2. Pt will score greater than or equal to 46 on FOTO in order to demonstrate improved perception of function due to symptoms.  Baseline: 36  Goal status: INITIAL    LONG TERM GOALS: Target date: 09/29/2022 Pt will score 57 on FOTO in order to demonstrate improved perception of function due to symptoms. Baseline: 36 Goal status:  INITIAL  2. Pt will demonstrate at least 50 degrees of active cervical rotation ROM  bilaterally in order to demonstrate improved environmental awareness and safety with driving.  Baseline: see ROM chart above Goal status: INITIAL  3. Pt will report at least 50% decrease in overall pain levels in past week in order to facilitate improved tolerance to basic ADLs/mobility.   Baseline: 7-10/10  Goal status: INITIAL    4. Pt will report/demonstrate ability to sit/stand for up to an hour with less than 2 point increase on NPS in order to demonstrate improved tolerance to community activity. Baseline: 7-10/10 pain with typical activities Goal status: INITIAL   5. Pt will endorse at least 50% improvement in frequency of waking due to pain in order to improve quality of life and overall health.  Baseline: waking 8-9x/night due to pain  Goal status: INITIAL   6. Pt will demonstrate at least 110deg of active L shoulder flexion ROM in  order to facilitate improved functional reaching and participating in daily activities.   Baseline: see ROM chart above  Goal status: INITIAL    PLAN:  PT FREQUENCY: 2x/week  PT DURATION: 8 weeks  PLANNED INTERVENTIONS: Therapeutic exercises, Therapeutic activity, Neuromuscular re-education, Balance training, Gait training, Patient/Family education, Self Care, Joint mobilization, Aquatic Therapy, Dry Needling, Electrical stimulation, Spinal mobilization, Cryotherapy, Moist heat, Taping, Manual therapy, and Re-evaluation  PLAN FOR NEXT SESSION:  review/update HEP PRN. Symptom modification strategies as appropriate/indicated. Work on cervical/GH mobility and periscapular activation within pt tolerance.     Ashley Murrain PT, DPT 08/26/2022 12:03 PM

## 2022-08-30 NOTE — Therapy (Signed)
OUTPATIENT PHYSICAL THERAPY TREATMENT NOTE   Patient Name: Mindy Stevens MRN: [id number] DOB:1956/01/14, 67 y.o., female Today's Date: 08/31/2022  END OF SESSION:  PT End of Session - 08/31/22 1010     Visit Number 5    Number of Visits 17    Date for PT Re-Evaluation 09/29/22    Authorization Type [employer]/M[employer]    Authorization Time Period no auth per appt notes    Progress Note Due on Visit 10    PT Start Time 1015    PT Stop Time 1058    PT Time Calculation (min) 43 min    Activity Tolerance Other (comment)   see assessment below   Behavior During Therapy WFL for tasks assessed/performed                 Past Medical History:  Diagnosis Date   GERD (gastroesophageal reflux disease)    Hyperlipidemia    History reviewed. No pertinent surgical history. Patient Active Problem List   Diagnosis Date Noted   Other spondylosis with radiculopathy, cervical region 07/13/2022   Impingement syndrome of left shoulder 04/26/2022   Dyspnea 12/19/2020    PCP: Dot Been, FNP  REFERRING PROVIDER: Eldred Manges, MD  REFERRING DIAG: [phone] (ICD-10-CM) - Chronic left shoulder pain M47.812 (ICD-10-CM) - Cervical spondylosis  THERAPY DIAG:  Cervicalgia  Abnormal posture  Left shoulder pain, unspecified chronicity  Rationale for Evaluation and Treatment: Rehabilitation  ONSET DATE: 2-3 months for weakness, neck/shoulder pain for years   SUBJECTIVE:                                                                                                                                                                                                        Per eval - Pt endorses history of difficulty with lifting items, difficulty washing and doing hair. Pt endorses chronic BIL shoulder pain, R used to be worse but responded well to an injection. Developed similar pain in L shoulder which she states has not really responded to injections. Symptoms worsen with activity and  prolonged positioning. Wakes up frequently throughout the night due to pain (~8-9x/night). States symptoms refer down LUE towards wrist, no N/T. Does describe some L hand weakness. Endorses chronic headaches in mornings that resolve after a bit of time/movement, non worsening over time.  Hand dominance: Right  SUBJECTIVE STATEMENT: 08/31/2022 Pt states she felt [place] relief for remainder of day after last session. Reports [place] HEP adherence although has some soreness. Reports a lot of pain earlier this morning but put some pain cream on  her shoulder which has improved things, no pain at rest, just when moving. Denies any other new updates on arrival    PERTINENT HISTORY:  GERD, pt also endorses hx of fibromyalgia  PAIN:  Are you having pain: no pain at rest Location/description: L anterior shoulder   Per eval -  Best-worst over past week: 7-10/10  - aggravating factors: standing/sitting 30-49min, lifting items, doing hair, self care - Easing factors: medication, cervical sidebending  towards R  PRECAUTIONS: None  WEIGHT BEARING RESTRICTIONS: No  FALLS:  Has patient fallen in last 6 months? Yes. Number of falls 1; beginning of Spring this year, fell in her sisters yard, had some ankle pain but denies other injury, states she followed up with her doctor afterwards   LIVING ENVIRONMENT: Lives alone in apartment, no STE. Son and his girlfriend check in on her daily Pt states family helps with heavier activities around the apartment  OCCUPATION: not working currently  PLOF: Independent - family assist for heavier activities around the home  PATIENT GOALS: get back to praise dancing, very involved in church  NEXT MD VISIT: TBD   OBJECTIVE: (objective measures completed at initial evaluation unless otherwise dated)   DIAGNOSTIC FINDINGS:  L shoulder MRI 07/01/22, below per EPIC: "IMPRESSION: Moderate tendinosis of the distal supraspinatus and infraspinatus tendons with articular  sided fraying and low-grade bursal sided tearing near the footprint, and focal calcification proximal to the footprint compatible with calcific tendinosis. No retracted cuff tear. No significant muscle atrophy.   Mild subacromial-subdeltoid bursitis.   Moderate glenohumeral osteoarthritis. Degenerative posterior and superior labral fraying.   Mild AC joint arthropathy."  Cervical XR 11/26/21 "IMPRESSION: Moderate multilevel degenerative changes of the cervical spine. No acute fracture or malalignment."  PATIENT SURVEYS:  FOTO 36 current, 57 predicted  COGNITION: Overall cognitive status: Within functional limits for tasks assessed  SENSATION: NT on eval 08/11/22: Light touch intact B UE aside from mild reduction C6 on L side. Negative hoffmans/tromner signs BIL UE   POSTURE: forward head, elevated B UT, rounded shoulders, holding head slightly towards R, LUE guarded  PALPATION: Deferred given time constraints  08/11/22: concordant tenderness/pain with L LS/UT, rhomboids, trigger point in infraspinatus. Reproduction of UE symptoms with trigger points in infraspinatus and levator scap  CERVICAL ROM:   Active ROM A/PROM (deg) eval AROM 09/01/22  Flexion 100% no pain   Extension 75% painful in UE to elbow   Right lateral flexion 50% **   Left lateral flexion 75% *   Right rotation 52 deg 52 deg  Left rotation 20 deg * 40 deg *   (Blank rows = not tested) (Key: WFL = within functional limits not formally assessed, * = concordant pain, s = stiffness/stretching sensation, NT = not tested)   UPPER EXTREMITY ROM:  Active ROM Right eval Left eval Left 08/31/22  Shoulder flexion 145 deg 68 deg * 82 deg *  Shoulder abduction 92 deg 48 deg * 50 deg *  Shoulder internal rotation (functional combo)  Greater trochanter * Superior glute *  Shoulder external rotation     Elbow flexion     Elbow extension     Wrist flexion     Wrist extension      (Blank rows = not tested) (Key:  WFL = within functional limits not formally assessed, * = concordant pain, s = stiffness/stretching sensation, NT = not tested)  Comments:   UPPER EXTREMITY MMT:  MMT Right eval Left eval  Shoulder flexion  Shoulder extension    Shoulder abduction    Shoulder extension    Shoulder internal rotation    Shoulder external rotation    Elbow flexion    Elbow extension    Grip strength    (Blank rows = not tested)  (Key: WFL = within functional limits not formally assessed, * = concordant pain, s = stiffness/stretching sensation, NT = not tested)  Comments: deferred given symptom irritability  CERVICAL SPECIAL TESTS:  Deferred given symptom irritability although sidebending towards L and cervical extension do provoke LUE symptoms  FUNCTIONAL TESTS:  Overhead and IR functional reach testing significantly limited on L compared to R  Vitals 08/31/22:  HR 73bpm, SpO2 92-95% RA, 125/70 BP R arm Headache, mild dizziness, L shoulder/sternum pain, mildly SOB (discomfort with inspiration) ~72min rest HR 79-80bpm, BP 118/70 R arm SpO2 93-94% Symptoms resolved at this point   TODAY'S TREATMENT:                                                                                                                              [facility] Adult PT Treatment:                                                DATE: 08/31/22 Therapeutic Exercise: Shoulder flexion iso at wall x12 cues for setup and appropriate force output   Manual Therapy: Gentle STM L LS, cervical paraspinals, rhomboids, deltoid, infraspinatus, teres minor, biceps; seated w/ open palm technique for reduced concentration of pressure, gentle trigger point release throughout. Increased time spent with trigger points in levator scapula. increased irritability today but improves notably after manual   Deferred further treatment after shoulder flexion isometrics, time spent w/ vitals/education; see assessment below    [facility] Adult PT Treatment:                                                 DATE: 08/26/22 Therapeutic Exercise: Scapular retraction 3x10 cues for posture Shoulder flexion iso 2x6 at doorway, cues for setup and appropriate force output, monitoring symptoms Chin tuck x10 cues for posture  HEP update + education on appropriate performance  Manual Therapy: Gentle STM L LS, cervical paraspinals, rhomboids, deltoid, infraspinatus, teres minor, biceps; seated w/ open palm technique for reduced concentration of pressure, gentle oscillations with low amplitude for reduced muscular guarding/tension. Increased time spent with trigger points in infraspinatus   [facility] Adult PT Treatment:  DATE: 08/24/22 Therapeutic Exercise: Scapular retraction 2x10 cues for elbow positioning  Chin tuck x8, seated, cues for reduced ROM/compensations  Standing swiss ball rollout for GH flexion x15 cues for comfortable ROM Standing swiss ball press down 2x8 cues for posture and comfortable force output  Manual Therapy: Gentle STM L LS, cervical paraspinals, rhomboids, deltoid, infraspinatus, teres minor, biceps; seated w/ open palm technique for reduced concentration of pressure, gentle oscillations with low amplitude for reduced muscular guarding/tension    [facility] Adult PT Treatment:                                                DATE: 08/11/22 Therapeutic Exercise: Scapular retraction 2x10 cues for posture and arm positioning Min ROM radial nerve (wrist supination/pronation in elbow flexed position) x10 cues for form, appropriate ROM Min ROM medial nerve (wrist ext/flex in elbow flexed position) x5 (discontinued due to discomfort) Chin tucks, seated, x8 cues for form Swiss ball GH flexion at table, x10, x5 cues for comfortable ROM HEP update + education  Manual Therapy: Gentle STM L LS, UT, rhomboid, infraspinatus. Trigger point release (minimal pressure LS); seated  [facility] Adult PT Treatment:                                                 DATE: 08/04/22 Therapeutic Exercise: Scap retraction x10 cues for comfortable ROM and posture LS towards R only 30sec, cues for comfortable ROM and breath control HEP handout + education, discussion re: modification based on symptom response  PATIENT EDUCATION:  Education details: rationale for interventions, see assessment for further education Person educated: Patient Education method: Explanation, Demonstration, Tactile cues, Verbal cues, and Handouts Education comprehension: verbalized understanding, returned demonstration, verbal cues required, tactile cues required, and needs further education    HOME EXERCISE PROGRAM: Access Code: [email or web address] URL: [email or web address] Date: 08/26/2022 Prepared by: Fransisco Hertz  Exercises - Seated Scapular Retraction  - 2-3 x daily - 7 x weekly - 1 sets - 8 reps - Seated Shoulder Flexion Towel Slide at Table Top  - 2-3 x daily - 7 x weekly - 1 sets - 8 reps - Standing Isometric Shoulder Flexion with Doorway - Arm Bent  - 2-3 x daily - 7 x weekly - 1 sets - 6 reps  ASSESSMENT:  CLINICAL IMPRESSION: 08/31/2022 Pt arrived w/o resting pain, session initiated with manual given positive response in previous sessions - some increased irritability noted but does improve after manual. Today's session limited as after cessation of shoulder flexion isometrics, pt reported L anterior shoulder pain referring towards lateral portion of her sternum as well as inspiratory pain. In discussion with pt, she stated she has been feeling "weird" this morning; woke up with a headache (which she stated is common for her) but also having some dizziness, feels as though breathing is a little difficult (did not appear dyspneic in clinic). Vitals checked and were grossly WNL (see above) although given broad constellation of symptoms, advised pt for EMS/ED which she politely declined. Aforementioned symptoms resolved after ~37min, vitals  remained stable as above. Pt educated that with her reported symptoms EMS/ED work up would remain advisable; she declined, stated that her PCP office is nearby and she would prefer to  see them, and that she would report there directly from our office. Further treatment deferred on this date. Pt departed session in no acute distress with reported resolution of symptoms.   Per eval - Pt is a very pleasant 67 year old woman who arrives to PT evaluation on this date for neck and L shoulder pain. Pt reports difficulty with lifting items, reaching with LUE, and self-care/household tasks due to pain. During today's session pt demonstrates limitations in cervical and GH mobility which are likely contributing to difficulty with aforementioned activities, LUE symptoms are reproduced by cervical extension and sidebending to L. Recommend skilled PT to address aforementioned deficits to improve functional independence/tolerance. No adverse events, symptoms are fairly irritable on exam, some mild increase in symptoms on departure but tolerates HEP relatively well. Pt departs today's session in no acute distress, all voiced questions/concerns addressed appropriately from PT perspective.    OBJECTIVE IMPAIRMENTS: decreased activity tolerance, decreased endurance, decreased mobility, decreased ROM, decreased strength, impaired UE functional use, improper body mechanics, postural dysfunction, and pain.   ACTIVITY LIMITATIONS: carrying, lifting, sitting, standing, sleeping, dressing, reach over head, and hygiene/grooming  PARTICIPATION LIMITATIONS: meal prep, cleaning, laundry, driving, and community activity  PERSONAL FACTORS: Age and Time since onset of injury/illness/exacerbation are also affecting patient's functional outcome.   REHAB POTENTIAL: [place]  CLINICAL DECISION MAKING: Stable/uncomplicated  EVALUATION COMPLEXITY: Low   GOALS: Goals reviewed with patient? No  SHORT TERM GOALS: Target date:  09/01/2022 Pt will demonstrate appropriate understanding and performance of initially prescribed HEP in order to facilitate improved independence with management of symptoms.  Baseline: HEP provided on eval 08/31/22: reports [place] HEP adherence Goal status: ONGOING  2. Pt will score greater than or equal to 46 on FOTO in order to demonstrate improved perception of function due to symptoms.  Baseline: 36  08/31/22: deferred given visit 5  Goal status: ONGOING  LONG TERM GOALS: Target date: 09/29/2022 Pt will score 57 on FOTO in order to demonstrate improved perception of function due to symptoms. Baseline: 36 Goal status: INITIAL  2. Pt will demonstrate at least 50 degrees of active cervical rotation ROM  bilaterally in order to demonstrate improved environmental awareness and safety with driving.  Baseline: see ROM chart above Goal status: INITIAL  3. Pt will report at least 50% decrease in overall pain levels in past week in order to facilitate improved tolerance to basic ADLs/mobility.   Baseline: 7-10/10  Goal status: INITIAL    4. Pt will report/demonstrate ability to sit/stand for up to an hour with less than 2 point increase on NPS in order to demonstrate improved tolerance to community activity. Baseline: 7-10/10 pain with typical activities Goal status: INITIAL   5. Pt will endorse at least 50% improvement in frequency of waking due to pain in order to improve quality of life and overall health.  Baseline: waking 8-9x/night due to pain  Goal status: INITIAL   6. Pt will demonstrate at least 110deg of active L shoulder flexion ROM in order to facilitate improved functional reaching and participating in daily activities.   Baseline: see ROM chart above  Goal status: INITIAL    PLAN:  PT FREQUENCY: 2x/week  PT DURATION: 8 weeks  PLANNED INTERVENTIONS: Therapeutic exercises, Therapeutic activity, Neuromuscular re-education, Balance training, Gait training, Patient/Family  education, Self Care, Joint mobilization, Aquatic Therapy, Dry Needling, Electrical stimulation, Spinal mobilization, Cryotherapy, Moist heat, Taping, Manual therapy, and Re-evaluation  PLAN FOR NEXT SESSION:  check in re: symptoms and PCP visit.  Would recommend gentle manual for symptom modification as appropriate, postural stability and gentle GH/cervical mobility. review/update HEP PRN.    Ashley Murrain PT, DPT 08/31/2022 1:08 PM

## 2022-08-31 ENCOUNTER — Encounter: Payer: Self-pay | Admitting: Physical Therapy

## 2022-08-31 ENCOUNTER — Ambulatory Visit: Payer: 59 | Admitting: Physical Therapy

## 2022-08-31 DIAGNOSIS — M25512 Pain in left shoulder: Secondary | ICD-10-CM

## 2022-08-31 DIAGNOSIS — M542 Cervicalgia: Secondary | ICD-10-CM

## 2022-08-31 DIAGNOSIS — R293 Abnormal posture: Secondary | ICD-10-CM

## 2022-08-31 NOTE — Therapy (Signed)
OUTPATIENT PHYSICAL THERAPY TREATMENT NOTE   Patient Name: Mindy Stevens MRN: [id number] DOB:06/10/1955, 67 y.o., female Today's Date: 09/02/2022  END OF SESSION:  PT End of Session - 09/02/22 1017     Visit Number 6    Number of Visits 17    Date for PT Re-Evaluation 09/29/22    Authorization Type [employer]/M[employer]    Authorization Time Period no auth per appt notes    PT Start Time 1017    PT Stop Time 1101    PT Time Calculation (min) 44 min    Activity Tolerance Patient limited by pain;Patient tolerated treatment well    Behavior During Therapy [facility] for tasks assessed/performed                  Past Medical History:  Diagnosis Date   GERD (gastroesophageal reflux disease)    Hyperlipidemia    History reviewed. No pertinent surgical history. Patient Active Problem List   Diagnosis Date Noted   Other spondylosis with radiculopathy, cervical region 07/13/2022   Impingement syndrome of left shoulder 04/26/2022   Dyspnea 12/19/2020    PCP: Dot Been, FNP  REFERRING PROVIDER: Eldred Manges, MD  REFERRING DIAG: [phone] (ICD-10-CM) - Chronic left shoulder pain M47.812 (ICD-10-CM) - Cervical spondylosis  THERAPY DIAG:  Cervicalgia  Abnormal posture  Left shoulder pain, unspecified chronicity  Rationale for Evaluation and Treatment: Rehabilitation  ONSET DATE: 2-3 months for weakness, neck/shoulder pain for years   SUBJECTIVE:  I went to the MD yesterday after PT with DR Sherron Flemings. She said I had vertigo and not heart  related.  Pain is 10/10 when I first came in. It is really hurting right now.                                                                                                                                                                                                        Per eval - Pt endorses history of difficulty with lifting items, difficulty washing and doing hair. Pt endorses chronic BIL shoulder pain, R used to be worse but  responded well to an injection. Developed similar pain in L shoulder which she states has not really responded to injections. Symptoms worsen with activity and prolonged positioning. Wakes up frequently throughout the night due to pain (~8-9x/night). States symptoms refer down LUE towards wrist, no N/T. Does describe some L hand weakness. Endorses chronic headaches in mornings that resolve after a bit of time/movement, non worsening over time.  Hand dominance: Right  SUBJECTIVE STATEMENT: 09/02/2022 Pt states she felt  Darius relief for remainder of day after last session. Reports Yin HEP adherence although has some soreness. Reports a lot of pain earlier this morning but put some pain cream on her shoulder which has improved things, no pain at rest, just when moving. Denies any other new updates on arrival    PERTINENT HISTORY:  GERD, pt also endorses hx of fibromyalgia  PAIN:  Are you having pain: no pain at rest Location/description: L anterior shoulder   Per eval -  Best-worst over past week: 7-10/10  - aggravating factors: standing/sitting 30-62min, lifting items, doing hair, self care - Easing factors: medication, cervical sidebending  towards R  PRECAUTIONS: None  WEIGHT BEARING RESTRICTIONS: No  FALLS:  Has patient fallen in last 6 months? Yes. Number of falls 1; beginning of Spring this year, fell in her sisters yard, had some ankle pain but denies other injury, states she followed up with her doctor afterwards   LIVING ENVIRONMENT: Lives alone in apartment, no STE. Son and his girlfriend check in on her daily Pt states family helps with heavier activities around the apartment  OCCUPATION: not working currently  PLOF: Independent - family assist for heavier activities around the home  PATIENT GOALS: get back to praise dancing, very involved in church  NEXT MD VISIT: TBD   OBJECTIVE: (objective measures completed at initial evaluation unless otherwise dated)   DIAGNOSTIC  FINDINGS:  L shoulder MRI 07/01/22, below per EPIC: "IMPRESSION: Moderate tendinosis of the distal supraspinatus and infraspinatus tendons with articular sided fraying and low-grade bursal sided tearing near the footprint, and focal calcification proximal to the footprint compatible with calcific tendinosis. No retracted cuff tear. No significant muscle atrophy.   Mild subacromial-subdeltoid bursitis.   Moderate glenohumeral osteoarthritis. Degenerative posterior and superior labral fraying.   Mild AC joint arthropathy."  Cervical XR 11/26/21 "IMPRESSION: Moderate multilevel degenerative changes of the cervical spine. No acute fracture or malalignment."  PATIENT SURVEYS:  FOTO 36 current, 57 predicted 09-02-22  41%  COGNITION: Overall cognitive status: Within functional limits for tasks assessed  SENSATION: NT on eval 08/11/22: Light touch intact B UE aside from mild reduction C6 on L side. Negative hoffmans/tromner signs BIL UE   POSTURE: forward head, elevated B UT, rounded shoulders, holding head slightly towards R, LUE guarded  PALPATION: Deferred given time constraints  08/11/22: concordant tenderness/pain with L LS/UT, rhomboids, trigger point in infraspinatus. Reproduction of UE symptoms with trigger points in infraspinatus and levator scap  CERVICAL ROM:   Active ROM A/PROM (deg) eval AROM 09/01/22  Flexion 100% no pain   Extension 75% painful in UE to elbow   Right lateral flexion 50% **   Left lateral flexion 75% *   Right rotation 52 deg 52 deg  Left rotation 20 deg * 40 deg *   (Blank rows = not tested) (Key: WFL = within functional limits not formally assessed, * = concordant pain, s = stiffness/stretching sensation, NT = not tested)   UPPER EXTREMITY ROM:  Active ROM Right eval Left eval Left 08/31/22  Shoulder flexion 145 deg 68 deg * 82 deg *  Shoulder abduction 92 deg 48 deg * 50 deg *  Shoulder internal rotation (functional combo)  Greater  trochanter * Superior glute *  Shoulder external rotation     Elbow flexion     Elbow extension     Wrist flexion     Wrist extension      (Blank rows = not tested) (Key: WFL = within  functional limits not formally assessed, * = concordant pain, s = stiffness/stretching sensation, NT = not tested)  Comments:   UPPER EXTREMITY MMT:  MMT Right eval Left eval  Shoulder flexion    Shoulder extension    Shoulder abduction    Shoulder extension    Shoulder internal rotation    Shoulder external rotation    Elbow flexion    Elbow extension    Grip strength    (Blank rows = not tested)  (Key: WFL = within functional limits not formally assessed, * = concordant pain, s = stiffness/stretching sensation, NT = not tested)  Comments: deferred given symptom irritability  CERVICAL SPECIAL TESTS:  Deferred given symptom irritability although sidebending towards L and cervical extension do provoke LUE symptoms  FUNCTIONAL TESTS:  Overhead and IR functional reach testing significantly limited on L compared to R     TODAY'S TREATMENT:  [facility] Adult PT Treatment:                                                DATE: 09-02-22 FOTO  41% Vitals 08/31/22:  HR 73bpm, SpO2 92-95% RA, 96/70 BP R arm Pt reports feeling dizzy due to vertigo and checked at MD yesterday ~40min rest HR 79-80bpm, BP 118/70 R arm SpO2 93-94% Symptoms resolved at this point Therapeutic Exercise: Standing using physioball for gentle flexion and scaption of bil  UE Side lying on R with AAROM with PT for shoulder trio to 90 degrees flexion, abd and ER Manual Therapy: Gentle STM L LS, cervical paraspinals, rhomboids, deltoid, infraspinatus, teres minor Posterior capsule stretch in R sidelying Gentle grade 2 mobs of GHJ of left shoulder and LAD Trigger Point Dry-Needling performed 12/17/21  by Garen Lah Treatment instructions: Expect mild to moderate muscle soreness. S/S of pneumothorax if dry needled over a lung  field, and to seek immediate medical attention should they occur. Patient verbalized understanding of these instructions and education.  Patient Consent Given: Yes Education handout provided: Previously provided Muscles treated: Left UT/LS two points with twitch response Electrical stimulation performed: No Parameters: N/A Treatment response/outcome: twitch response noted, pt noted relief                                                                                                                            [facility] Adult PT Treatment:                                                DATE: 08/31/22 Therapeutic Exercise: Shoulder flexion iso at wall x12 cues for setup and appropriate force output   Manual Therapy: Gentle STM L LS, cervical paraspinals, rhomboids, deltoid, infraspinatus, teres minor, biceps; seated w/ open  palm technique for reduced concentration of pressure, gentle trigger point release throughout. Increased time spent with trigger points in levator scapula. increased irritability today but improves notably after manual   Deferred further treatment after shoulder flexion isometrics, time spent w/ vitals/education; see assessment below    [facility] Adult PT Treatment:                                                DATE: 08/26/22 Therapeutic Exercise: Scapular retraction 3x10 cues for posture Shoulder flexion iso 2x6 at doorway, cues for setup and appropriate force output, monitoring symptoms Chin tuck x10 cues for posture  HEP update + education on appropriate performance  Manual Therapy: Gentle STM L LS, cervical paraspinals, rhomboids, deltoid, infraspinatus, teres minor, biceps; seated w/ open palm technique for reduced concentration of pressure, gentle oscillations with low amplitude for reduced muscular guarding/tension. Increased time spent with trigger points in infraspinatus   [facility] Adult PT Treatment:                                                DATE: 08/24/22 Therapeutic  Exercise: Scapular retraction 2x10 cues for elbow positioning  Chin tuck x8, seated, cues for reduced ROM/compensations  Standing swiss ball rollout for GH flexion x15 cues for comfortable ROM Standing swiss ball press down 2x8 cues for posture and comfortable force output  Manual Therapy: Gentle STM L LS, cervical paraspinals, rhomboids, deltoid, infraspinatus, teres minor, biceps; seated w/ open palm technique for reduced concentration of pressure, gentle oscillations with low amplitude for reduced muscular guarding/tension    [facility] Adult PT Treatment:                                                DATE: 08/11/22 Therapeutic Exercise: Scapular retraction 2x10 cues for posture and arm positioning Min ROM radial nerve (wrist supination/pronation in elbow flexed position) x10 cues for form, appropriate ROM Min ROM medial nerve (wrist ext/flex in elbow flexed position) x5 (discontinued due to discomfort) Chin tucks, seated, x8 cues for form Swiss ball GH flexion at table, x10, x5 cues for comfortable ROM HEP update + education  Manual Therapy: Gentle STM L LS, UT, rhomboid, infraspinatus. Trigger point release (minimal pressure LS); seated  [facility] Adult PT Treatment:                                                DATE: 08/04/22 Therapeutic Exercise: Scap retraction x10 cues for comfortable ROM and posture LS towards R only 30sec, cues for comfortable ROM and breath control HEP handout + education, discussion re: modification based on symptom response  PATIENT EDUCATION:  Education details: rationale for interventions, see assessment for further education Person educated: Patient Education method: Explanation, Demonstration, Tactile cues, Verbal cues, and Handouts Education comprehension: verbalized understanding, returned demonstration, verbal cues required, tactile cues required, and needs further education    HOME EXERCISE PROGRAM: Access Code: [email or web address] URL:  [email or web address] Date: 08/26/2022 Prepared by: Onalee Hua  Guthrie  Exercises - Seated Scapular Retraction  - 2-3 x daily - 7 x weekly - 1 sets - 8 reps - Seated Shoulder Flexion Towel Slide at Table Top  - 2-3 x daily - 7 x weekly - 1 sets - 8 reps - Standing Isometric Shoulder Flexion with Doorway - Arm Bent  - 2-3 x daily - 7 x weekly - 1 sets - 6 reps  ASSESSMENT:  CLINICAL IMPRESSION: 09/02/2022  Pt arrived after incident with symptoms addressed from yesterday at Dr Grace Blight office. Pt with vertigo and cleared for cardiac symptoms.  Pt was tearful today and tired of pain and expressed frustration with not feeling better.  Pt with 10/10 pain in Left arm and vitals initially lower but normalized through out treatment.  Pt consented to TPDN and was closely monitored throughout session.  Pt achieved 5/10 pain level after Manual, TPDN and exercise.  Will continue to progress as pt is able.Pt departed session in no acute distress with reported resolution of symptoms.      Per eval - Pt is a very pleasant 67 year old woman who arrives to PT evaluation on this date for neck and L shoulder pain. Pt reports difficulty with lifting items, reaching with LUE, and self-care/household tasks due to pain. During today's session pt demonstrates limitations in cervical and GH mobility which are likely contributing to difficulty with aforementioned activities, LUE symptoms are reproduced by cervical extension and sidebending to L. Recommend skilled PT to address aforementioned deficits to improve functional independence/tolerance. No adverse events, symptoms are fairly irritable on exam, some mild increase in symptoms on departure but tolerates HEP relatively well. Pt departs today's session in no acute distress, all voiced questions/concerns addressed appropriately from PT perspective.    OBJECTIVE IMPAIRMENTS: decreased activity tolerance, decreased endurance, decreased mobility, decreased ROM,  decreased strength, impaired UE functional use, improper body mechanics, postural dysfunction, and pain.   ACTIVITY LIMITATIONS: carrying, lifting, sitting, standing, sleeping, dressing, reach over head, and hygiene/grooming  PARTICIPATION LIMITATIONS: meal prep, cleaning, laundry, driving, and community activity  PERSONAL FACTORS: Age and Time since onset of injury/illness/exacerbation are also affecting patient's functional outcome.   REHAB POTENTIAL: [place]  CLINICAL DECISION MAKING: Stable/uncomplicated  EVALUATION COMPLEXITY: Low   GOALS: Goals reviewed with patient? No  SHORT TERM GOALS: Target date: 09/01/2022 Pt will demonstrate appropriate understanding and performance of initially prescribed HEP in order to facilitate improved independence with management of symptoms.  Baseline: HEP provided on eval 08/31/22: reports Duecker HEP adherence Goal status: ONGOING  2. Pt will score greater than or equal to 46 on FOTO in order to demonstrate improved perception of function due to symptoms.  Baseline: 36  08/31/22: deferred given visit 5  Goal status: ONGOING  LONG TERM GOALS: Target date: 09/29/2022 Pt will score 57 on FOTO in order to demonstrate improved perception of function due to symptoms. Baseline: 36 Goal status: INITIAL  2. Pt will demonstrate at least 50 degrees of active cervical rotation ROM  bilaterally in order to demonstrate improved environmental awareness and safety with driving.  Baseline: see ROM chart above Goal status: INITIAL  3. Pt will report at least 50% decrease in overall pain levels in past week in order to facilitate improved tolerance to basic ADLs/mobility.   Baseline: 7-10/10  Goal status: INITIAL    4. Pt will report/demonstrate ability to sit/stand for up to an hour with less than 2 point increase on NPS in order to demonstrate improved tolerance to community activity.  Baseline: 7-10/10 pain with typical activities Goal status: INITIAL   5.  Pt will endorse at least 50% improvement in frequency of waking due to pain in order to improve quality of life and overall health.  Baseline: waking 8-9x/night due to pain  Goal status: INITIAL   6. Pt will demonstrate at least 110deg of active L shoulder flexion ROM in order to facilitate improved functional reaching and participating in daily activities.   Baseline: see ROM chart above  Goal status: INITIAL    PLAN:  PT FREQUENCY: 2x/week  PT DURATION: 8 weeks  PLANNED INTERVENTIONS: Therapeutic exercises, Therapeutic activity, Neuromuscular re-education, Balance training, Gait training, Patient/Family education, Self Care, Joint mobilization, Aquatic Therapy, Dry Needling, Electrical stimulation, Spinal mobilization, Cryotherapy, Moist heat, Taping, Manual therapy, and Re-evaluation  PLAN FOR NEXT SESSION:  check in re: symptoms and PCP visit. Would recommend gentle manual for symptom modification as appropriate, postural stability and gentle GH/cervical mobility. review/update HEP PRN.  Possible TPDN if pt symptoms improved from last TPDN   Garen Lah, PT, [facility] Certified Exercise Expert for the Aging Adult  09/02/22 11:23 AM Phone: [phone] Fax: [phone]

## 2022-09-02 ENCOUNTER — Ambulatory Visit: Payer: 59 | Admitting: Physical Therapy

## 2022-09-02 ENCOUNTER — Encounter: Payer: Self-pay | Admitting: Physical Therapy

## 2022-09-02 DIAGNOSIS — M542 Cervicalgia: Secondary | ICD-10-CM | POA: Diagnosis not present

## 2022-09-02 DIAGNOSIS — R293 Abnormal posture: Secondary | ICD-10-CM

## 2022-09-02 DIAGNOSIS — M25512 Pain in left shoulder: Secondary | ICD-10-CM

## 2022-09-02 NOTE — Patient Instructions (Signed)

## 2022-09-07 ENCOUNTER — Encounter: Payer: Self-pay | Admitting: Physical Therapy

## 2022-09-07 ENCOUNTER — Ambulatory Visit: Payer: 59 | Admitting: Physical Therapy

## 2022-09-07 DIAGNOSIS — M25512 Pain in left shoulder: Secondary | ICD-10-CM

## 2022-09-07 DIAGNOSIS — M542 Cervicalgia: Secondary | ICD-10-CM

## 2022-09-07 DIAGNOSIS — R293 Abnormal posture: Secondary | ICD-10-CM

## 2022-09-07 NOTE — Therapy (Signed)
OUTPATIENT PHYSICAL THERAPY TREATMENT NOTE   Patient Name: Mindy Stevens MRN: [id number] DOB:[birth year], 67 y.o., female Today's Date: 09/07/2022  END OF SESSION:  PT End of Session - 09/07/22 0758     Visit Number 7    Number of Visits 17    Date for PT Re-Evaluation 09/29/22    Authorization Type [employer]    Authorization Time Period no needed  auth per appt notes    Progress Note Due on Visit 10    PT Start Time 0759    PT Stop Time 0840    PT Time Calculation (min) 41 min    Activity Tolerance Patient limited by pain;Patient tolerated treatment well;No increased pain    Behavior During Therapy WFL for tasks assessed/performed                   Past Medical History:  Diagnosis Date   GERD (gastroesophageal reflux disease)    Hyperlipidemia    History reviewed. No pertinent surgical history. Patient Active Problem List   Diagnosis Date Noted   Other spondylosis with radiculopathy, cervical region 07/13/2022   Impingement syndrome of left shoulder 04/26/2022   Dyspnea 12/19/2020    PCP: Dot Been, FNP  REFERRING PROVIDER: Eldred Manges, MD  REFERRING DIAG: [phone] (ICD-10-CM) - Chronic left shoulder pain M47.812 (ICD-10-CM) - Cervical spondylosis  THERAPY DIAG:  Cervicalgia  Abnormal posture  Left shoulder pain, unspecified chronicity  Rationale for Evaluation and Treatment: Rehabilitation  ONSET DATE: 2-3 months for weakness, neck/shoulder pain for years   SUBJECTIVE:                                                                                                                                                                                  Per eval - Pt endorses history of difficulty with lifting items, difficulty washing and doing hair. Pt endorses chronic BIL shoulder pain, R used to be worse but responded well to an injection. Developed similar pain in L shoulder which she states has not really responded to injections. Symptoms  worsen with activity and prolonged positioning. Wakes up frequently throughout the night due to pain (~8-9x/night). States symptoms refer down LUE towards wrist, no N/T. Does describe some L hand weakness. Endorses chronic headaches in mornings that resolve after a bit of time/movement, non worsening over time.  Hand dominance: Right  SUBJECTIVE STATEMENT: 09/07/2022 Pt states she was pretty sore the day after needling but seemed like she had [place] relief for about a day after soreness resolved. Symptoms back to baseline, reporting 10/10 today. States her vertigo continues to give her some trouble and she thinks it is due to medication, seeing her  doctor today about it. States since starting PT she is able to tolerate a little more but still having about the same amount of pain. Would be interested in trying more needling.     PERTINENT HISTORY:  GERD, pt also endorses hx of fibromyalgia  PAIN:  Are you having pain: no pain at rest Location/description: L anterior shoulder   Per eval -  Best-worst over past week: 7-10/10  - aggravating factors: standing/sitting 30-55min, lifting items, doing hair, self care - Easing factors: medication, cervical sidebending  towards R  PRECAUTIONS: None  WEIGHT BEARING RESTRICTIONS: No  FALLS:  Has patient fallen in last 6 months? Yes. Number of falls 1; beginning of Spring this year, fell in her sisters yard, had some ankle pain but denies other injury, states she followed up with her doctor afterwards   LIVING ENVIRONMENT: Lives alone in apartment, no STE. Son and his girlfriend check in on her daily Pt states family helps with heavier activities around the apartment  OCCUPATION: not working currently  PLOF: Independent - family assist for heavier activities around the home  PATIENT GOALS: get back to praise dancing, very involved in church  NEXT MD VISIT: TBD   OBJECTIVE: (objective measures completed at initial evaluation unless otherwise  dated)   DIAGNOSTIC FINDINGS:  L shoulder MRI 07/01/22, below per EPIC: "IMPRESSION: Moderate tendinosis of the distal supraspinatus and infraspinatus tendons with articular sided fraying and low-grade bursal sided tearing near the footprint, and focal calcification proximal to the footprint compatible with calcific tendinosis. No retracted cuff tear. No significant muscle atrophy.   Mild subacromial-subdeltoid bursitis.   Moderate glenohumeral osteoarthritis. Degenerative posterior and superior labral fraying.   Mild AC joint arthropathy."  Cervical XR 11/26/21 "IMPRESSION: Moderate multilevel degenerative changes of the cervical spine. No acute fracture or malalignment."  PATIENT SURVEYS:  FOTO 36 current, 57 predicted 09-02-22  41%  COGNITION: Overall cognitive status: Within functional limits for tasks assessed  SENSATION: NT on eval 08/11/22: Light touch intact B UE aside from mild reduction C6 on L side. Negative hoffmans/tromner signs BIL UE   POSTURE: forward head, elevated B UT, rounded shoulders, holding head slightly towards R, LUE guarded  PALPATION: Deferred given time constraints  08/11/22: concordant tenderness/pain with L LS/UT, rhomboids, trigger point in infraspinatus. Reproduction of UE symptoms with trigger points in infraspinatus and levator scap  CERVICAL ROM:   Active ROM A/PROM (deg) eval AROM 09/01/22  Flexion 100% no pain   Extension 75% painful in UE to elbow   Right lateral flexion 50% **   Left lateral flexion 75% *   Right rotation 52 deg 52 deg  Left rotation 20 deg * 40 deg *   (Blank rows = not tested) (Key: WFL = within functional limits not formally assessed, * = concordant pain, s = stiffness/stretching sensation, NT = not tested)   UPPER EXTREMITY ROM:  Active ROM Right eval Left eval Left 08/31/22  Shoulder flexion 145 deg 68 deg * 82 deg *  Shoulder abduction 92 deg 48 deg * 50 deg *  Shoulder internal rotation (functional  combo)  Greater trochanter * Superior glute *  Shoulder external rotation     Elbow flexion     Elbow extension     Wrist flexion     Wrist extension      (Blank rows = not tested) (Key: WFL = within functional limits not formally assessed, * = concordant pain, s = stiffness/stretching sensation, NT = not tested)  Comments:   UPPER EXTREMITY MMT:  MMT Right eval Left eval  Shoulder flexion    Shoulder extension    Shoulder abduction    Shoulder extension    Shoulder internal rotation    Shoulder external rotation    Elbow flexion    Elbow extension    Grip strength    (Blank rows = not tested)  (Key: WFL = within functional limits not formally assessed, * = concordant pain, s = stiffness/stretching sensation, NT = not tested)  Comments: deferred given symptom irritability  CERVICAL SPECIAL TESTS:  Deferred given symptom irritability although sidebending towards L and cervical extension do provoke LUE symptoms  FUNCTIONAL TESTS:  Overhead and IR functional reach testing significantly limited on L compared to R     TODAY'S TREATMENT:  [facility] Adult PT Treatment:                                                DATE: 09/07/22 Therapeutic Exercise: Scap retraction + double ER (unweighted) 3x5 cues for posture and comfortable ROM  Scaption/flexion with physioball on table x8 each cues for comfortable ROM Swiss ball press down at table 2x10 cues for breath control  HEP review Manual Therapy: Seated; Gentle STM (open palm technique) to biceps, lateral pec, anterior delt, LS, UT, rhomboid. Able to reproduce distal UE symptoms and gradually resolve with increased time   [facility] Adult PT Treatment:                                                DATE: 09-02-22 FOTO  41% Vitals 08/31/22:  HR 73bpm, SpO2 92-95% RA, 96/70 BP R arm Pt reports feeling dizzy due to vertigo and checked at MD yesterday ~51min rest HR 79-80bpm, BP 118/70 R arm SpO2 93-94% Symptoms resolved at this  point Therapeutic Exercise: Standing using physioball for gentle flexion and scaption of bil  UE Side lying on R with AAROM with PT for shoulder trio to 90 degrees flexion, abd and ER Manual Therapy: Gentle STM L LS, cervical paraspinals, rhomboids, deltoid, infraspinatus, teres minor Posterior capsule stretch in R sidelying Gentle grade 2 mobs of GHJ of left shoulder and LAD Trigger Point Dry-Needling performed 12/17/21  by Garen Lah Treatment instructions: Expect mild to moderate muscle soreness. S/S of pneumothorax if dry needled over a lung field, and to seek immediate medical attention should they occur. Patient verbalized understanding of these instructions and education.  Patient Consent Given: Yes Education handout provided: Previously provided Muscles treated: Left UT/LS two points with twitch response Electrical stimulation performed: No Parameters: N/A Treatment response/outcome: twitch response noted, pt noted relief  [facility] Adult PT Treatment:                                                DATE: 08/31/22 Therapeutic Exercise: Shoulder flexion iso at wall x12 cues for setup and appropriate force output   Manual Therapy: Gentle STM L LS, cervical paraspinals, rhomboids, deltoid, infraspinatus, teres minor, biceps; seated w/ open palm technique for reduced concentration of pressure, gentle trigger point release throughout. Increased time spent with trigger points in levator scapula. increased irritability today but improves notably after manual   Deferred further treatment after shoulder flexion isometrics, time spent w/ vitals/education; see assessment below    [facility] Adult PT Treatment:                                                DATE: 08/26/22 Therapeutic Exercise: Scapular retraction 3x10 cues for posture Shoulder flexion iso 2x6 at doorway, cues  for setup and appropriate force output, monitoring symptoms Chin tuck x10 cues for posture  HEP update + education on appropriate performance  Manual Therapy: Gentle STM L LS, cervical paraspinals, rhomboids, deltoid, infraspinatus, teres minor, biceps; seated w/ open palm technique for reduced concentration of pressure, gentle oscillations with low amplitude for reduced muscular guarding/tension. Increased time spent with trigger points in infraspinatus   [facility] Adult PT Treatment:                                                DATE: 08/24/22 Therapeutic Exercise: Scapular retraction 2x10 cues for elbow positioning  Chin tuck x8, seated, cues for reduced ROM/compensations  Standing swiss ball rollout for GH flexion x15 cues for comfortable ROM Standing swiss ball press down 2x8 cues for posture and comfortable force output  Manual Therapy: Gentle STM L LS, cervical paraspinals, rhomboids, deltoid, infraspinatus, teres minor, biceps; seated w/ open palm technique for reduced concentration of pressure, gentle oscillations with low amplitude for reduced muscular guarding/tension    [facility] Adult PT Treatment:                                                DATE: 08/11/22 Therapeutic Exercise: Scapular retraction 2x10 cues for posture and arm positioning Min ROM radial nerve (wrist supination/pronation in elbow flexed position) x10 cues for form, appropriate ROM Min ROM medial nerve (wrist ext/flex in elbow flexed position) x5 (discontinued due to discomfort) Chin tucks, seated, x8 cues for form Swiss ball GH flexion at table, x10, x5 cues for comfortable ROM HEP update + education  Manual Therapy: Gentle STM L LS, UT, rhomboid, infraspinatus. Trigger point release (minimal pressure LS); seated  [facility] Adult PT Treatment:                                                DATE: 08/04/22 Therapeutic Exercise: Scap retraction x10 cues for  comfortable ROM and posture LS towards R only 30sec, cues for  comfortable ROM and breath control HEP handout + education, discussion re: modification based on symptom response  PATIENT EDUCATION:  Education details: rationale for interventions, see assessment for further education Person educated: Patient Education method: Explanation, Demonstration, Tactile cues, Verbal cues, and Handouts Education comprehension: verbalized understanding, returned demonstration, verbal cues required, tactile cues required, and needs further education    HOME EXERCISE PROGRAM: Access Code: [email or web address] URL: [email or web address] Date: 09/07/2022 Prepared by: Fransisco Hertz  Exercises - Seated Scapular Retraction  - 2-3 x daily - 7 x weekly - 1 sets - 8 reps - Seated Shoulder Flexion Towel Slide at Table Top  - 2-3 x daily - 7 x weekly - 1 sets - 8 reps  ASSESSMENT:  CLINICAL IMPRESSION: 09/07/2022  Pt arrives w/ 10/10 pain, reports some relief from needling last session, seeing her PCP today to follow up about her vertigo. Continues to initiate with manual which reduces pain to 8/10 and reduces distal UE referral. Continuing to address postural deficits and UE mobility which pt tolerates relatively well, some initial discomfort with activity that improves with repetition. No adverse events, departs with report of 8/10 pain and reduced UE symptoms. Although pt endorses some improvement since starting PT and has demonstrated improved mobility, given consistently high pain levels and limited activity tolerance reinforced follow up with referring provider (pt has follow up scheduled for next week). For now recommend continuing along current POC to address relevant impairments as progress as able/appropriate. Pt departs today's session in no acute distress, all voiced questions/concerns addressed appropriately from PT perspective.      Per eval - Pt is a very pleasant 67 year old woman who arrives to PT evaluation on this date for neck and L shoulder pain. Pt  reports difficulty with lifting items, reaching with LUE, and self-care/household tasks due to pain. During today's session pt demonstrates limitations in cervical and GH mobility which are likely contributing to difficulty with aforementioned activities, LUE symptoms are reproduced by cervical extension and sidebending to L. Recommend skilled PT to address aforementioned deficits to improve functional independence/tolerance. No adverse events, symptoms are fairly irritable on exam, some mild increase in symptoms on departure but tolerates HEP relatively well. Pt departs today's session in no acute distress, all voiced questions/concerns addressed appropriately from PT perspective.    OBJECTIVE IMPAIRMENTS: decreased activity tolerance, decreased endurance, decreased mobility, decreased ROM, decreased strength, impaired UE functional use, improper body mechanics, postural dysfunction, and pain.   ACTIVITY LIMITATIONS: carrying, lifting, sitting, standing, sleeping, dressing, reach over head, and hygiene/grooming  PARTICIPATION LIMITATIONS: meal prep, cleaning, laundry, driving, and community activity  PERSONAL FACTORS: Age and Time since onset of injury/illness/exacerbation are also affecting patient's functional outcome.   REHAB POTENTIAL: [place]  CLINICAL DECISION MAKING: Stable/uncomplicated  EVALUATION COMPLEXITY: Low   GOALS: Goals reviewed with patient? No  SHORT TERM GOALS: Target date: 09/01/2022 Pt will demonstrate appropriate understanding and performance of initially prescribed HEP in order to facilitate improved independence with management of symptoms.  Baseline: HEP provided on eval 08/31/22: reports [place] HEP adherence Goal status: ONGOING  2. Pt will score greater than or equal to 46 on FOTO in order to demonstrate improved perception of function due to symptoms.  Baseline: 36  08/31/22: deferred given visit 5  09/02/22: 41%   Goal status: ONGOING  LONG TERM GOALS: Target  date: 09/29/2022 Pt will score 57 on FOTO in order to demonstrate improved perception of  function due to symptoms. Baseline: 36 Goal status: INITIAL  2. Pt will demonstrate at least 50 degrees of active cervical rotation ROM  bilaterally in order to demonstrate improved environmental awareness and safety with driving.  Baseline: see ROM chart above Goal status: INITIAL  3. Pt will report at least 50% decrease in overall pain levels in past week in order to facilitate improved tolerance to basic ADLs/mobility.   Baseline: 7-10/10  Goal status: INITIAL    4. Pt will report/demonstrate ability to sit/stand for up to an hour with less than 2 point increase on NPS in order to demonstrate improved tolerance to community activity. Baseline: 7-10/10 pain with typical activities Goal status: INITIAL   5. Pt will endorse at least 50% improvement in frequency of waking due to pain in order to improve quality of life and overall health.  Baseline: waking 8-9x/night due to pain  Goal status: INITIAL   6. Pt will demonstrate at least 110deg of active L shoulder flexion ROM in order to facilitate improved functional reaching and participating in daily activities.   Baseline: see ROM chart above  Goal status: INITIAL    PLAN:  PT FREQUENCY: 2x/week  PT DURATION: 8 weeks  PLANNED INTERVENTIONS: Therapeutic exercises, Therapeutic activity, Neuromuscular re-education, Balance training, Gait training, Patient/Family education, Self Care, Joint mobilization, Aquatic Therapy, Dry Needling, Electrical stimulation, Spinal mobilization, Cryotherapy, Moist heat, Taping, Manual therapy, and Re-evaluation  PLAN FOR NEXT SESSION:  Would recommend gentle manual for symptom modification as appropriate, postural stability and gentle GH/cervical mobility. review/update HEP PRN. Pt interested in possibility of more dry needling.    Ashley Murrain PT, DPT 09/07/2022 8:48 AM

## 2022-09-08 NOTE — Therapy (Signed)
OUTPATIENT PHYSICAL THERAPY TREATMENT NOTE   Patient Name: SUNDUS PETE MRN: [id number] DOB:Apr 02, 1955, 67 y.o., female Today's Date: 09/09/2022  END OF SESSION:  PT End of Session - 09/09/22 0915     Visit Number 8    Number of Visits 17    Date for PT Re-Evaluation 09/29/22    Authorization Type [employer]/M[employer]    Authorization Time Period no needed  auth per appt notes    PT Start Time 0919    PT Stop Time 1010    PT Time Calculation (min) 51 min    Activity Tolerance Patient limited by pain;Patient tolerated treatment well;No increased pain    Behavior During Therapy WFL for tasks assessed/performed                    Past Medical History:  Diagnosis Date   GERD (gastroesophageal reflux disease)    Hyperlipidemia    History reviewed. No pertinent surgical history. Patient Active Problem List   Diagnosis Date Noted   Other spondylosis with radiculopathy, cervical region 07/13/2022   Impingement syndrome of left shoulder 04/26/2022   Dyspnea 12/19/2020    PCP: Dot Been, FNP  REFERRING PROVIDER: Eldred Manges, MD  REFERRING DIAG: [phone] (ICD-10-CM) - Chronic left shoulder pain M47.812 (ICD-10-CM) - Cervical spondylosis  THERAPY DIAG:  Cervicalgia  Abnormal posture  Left shoulder pain, unspecified chronicity  Rationale for Evaluation and Treatment: Rehabilitation  ONSET DATE: 2-3 months for weakness, neck/shoulder pain for years   SUBJECTIVE:                                                                                                                                                                                Per eval - Pt endorses history of difficulty with lifting items, difficulty washing and doing hair. Pt endorses chronic BIL shoulder pain, R used to be worse but responded well to an injection. Developed similar pain in L shoulder which she states has not really responded to injections. Symptoms worsen with activity and prolonged  positioning. Wakes up frequently throughout the night due to pain (~8-9x/night). States symptoms refer down LUE towards wrist, no N/T. Does describe some L hand weakness. Endorses chronic headaches in mornings that resolve after a bit of time/movement, non worsening over time.  Hand dominance: Right  SUBJECTIVE STATEMENT: 09/09/2022 "Today I having a lot of pain from he weather in both the neck and shoulder pain rated at 10/10. I also feel the pain going down my back today. "    PERTINENT HISTORY:  GERD, pt also endorses hx of fibromyalgia  PAIN:  Are you having pain: 10/10 (declined needing to go to the ED) Location/description:  L anterior shoulder   Per eval -  Best-worst over past week: 7-10/10  - aggravating factors: standing/sitting 30-89min, lifting items, doing hair, self care - Easing factors: medication, cervical sidebending  towards R  PRECAUTIONS: None  WEIGHT BEARING RESTRICTIONS: No  FALLS:  Has patient fallen in last 6 months? Yes. Number of falls 1; beginning of Spring this year, fell in her sisters yard, had some ankle pain but denies other injury, states she followed up with her doctor afterwards   LIVING ENVIRONMENT: Lives alone in apartment, no STE. Son and his girlfriend check in on her daily Pt states family helps with heavier activities around the apartment  OCCUPATION: not working currently  PLOF: Independent - family assist for heavier activities around the home  PATIENT GOALS: get back to praise dancing, very involved in church  NEXT MD VISIT: TBD   OBJECTIVE: (objective measures completed at initial evaluation unless otherwise dated)   DIAGNOSTIC FINDINGS:  L shoulder MRI 07/01/22, below per EPIC: "IMPRESSION: Moderate tendinosis of the distal supraspinatus and infraspinatus tendons with articular sided fraying and low-grade bursal sided tearing near the footprint, and focal calcification proximal to the footprint compatible with calcific  tendinosis. No retracted cuff tear. No significant muscle atrophy.   Mild subacromial-subdeltoid bursitis.   Moderate glenohumeral osteoarthritis. Degenerative posterior and superior labral fraying.   Mild AC joint arthropathy."  Cervical XR 11/26/21 "IMPRESSION: Moderate multilevel degenerative changes of the cervical spine. No acute fracture or malalignment."  PATIENT SURVEYS:  FOTO 36 current, 57 predicted 09-02-22  41%  COGNITION: Overall cognitive status: Within functional limits for tasks assessed  SENSATION: NT on eval 08/11/22: Light touch intact B UE aside from mild reduction C6 on L side. Negative hoffmans/tromner signs BIL UE   POSTURE: forward head, elevated B UT, rounded shoulders, holding head slightly towards R, LUE guarded  PALPATION: Deferred given time constraints  08/11/22: concordant tenderness/pain with L LS/UT, rhomboids, trigger point in infraspinatus. Reproduction of UE symptoms with trigger points in infraspinatus and levator scap  CERVICAL ROM:   Active ROM A/PROM (deg) eval AROM 09/01/22  Flexion 100% no pain   Extension 75% painful in UE to elbow   Right lateral flexion 50% **   Left lateral flexion 75% *   Right rotation 52 deg 52 deg  Left rotation 20 deg * 40 deg *   (Blank rows = not tested) (Key: WFL = within functional limits not formally assessed, * = concordant pain, s = stiffness/stretching sensation, NT = not tested)   UPPER EXTREMITY ROM:  Active ROM Right eval Left eval Left 08/31/22  Shoulder flexion 145 deg 68 deg * 82 deg *  Shoulder abduction 92 deg 48 deg * 50 deg *  Shoulder internal rotation (functional combo)  Greater trochanter * Superior glute *  Shoulder external rotation     Elbow flexion     Elbow extension     Wrist flexion     Wrist extension      (Blank rows = not tested) (Key: WFL = within functional limits not formally assessed, * = concordant pain, s = stiffness/stretching sensation, NT = not tested)   Comments:   UPPER EXTREMITY MMT:  MMT Right eval Left eval  Shoulder flexion    Shoulder extension    Shoulder abduction    Shoulder extension    Shoulder internal rotation    Shoulder external rotation    Elbow flexion    Elbow extension    Grip strength    (  Blank rows = not tested)  (Key: WFL = within functional limits not formally assessed, * = concordant pain, s = stiffness/stretching sensation, NT = not tested)  Comments: deferred given symptom irritability  CERVICAL SPECIAL TESTS:  Deferred given symptom irritability although sidebending towards L and cervical extension do provoke LUE symptoms  FUNCTIONAL TESTS:  Overhead and IR functional reach testing significantly limited on L compared to R   TODAY'S TREATMENT:  [facility] Adult PT Treatment:                                                DATE: 09/09/2022 BP R arm in sitting 116/56 O2 94%  Pulse 76   Therapeutic Exercise: Supine chin tuck 1 x 10 holding 10 second ea Seated scapular retraction 1 x 15 verbal cues to avoid shoulder hiking Seated L upper trap stretch 2 x 30 sec Seated scapular retraction with ER 2 x 10 with RTB  Updated HEP today for seated scapular retraction with ER, upper trap stretch, seated and supine chin tuck Manual Therapy: IASTM along the L upper trap Inhibition taping for the L upper trap Sub-occipital release  Trigger Point Dry-Needling  Treatment instructions: Expect mild to moderate muscle soreness. S/S of pneumothorax if dry needled over a lung field, and to seek immediate medical attention should they occur. Patient verbalized understanding of these instructions and education.  Patient Consent Given: No Education handout provided: Previously provided Muscles treated: L upper trap Electrical stimulation performed: No Parameters:  N/A Treatment response/outcome: twitch response, pt noted relief of tension    [facility] Adult PT Treatment:                                                 DATE: 09/07/22 Therapeutic Exercise: Scap retraction + double ER (unweighted) 3x5 cues for posture and comfortable ROM  Scaption/flexion with physioball on table x8 each cues for comfortable ROM Swiss ball press down at table 2x10 cues for breath control  HEP review Manual Therapy: Seated; Gentle STM (open palm technique) to biceps, lateral pec, anterior delt, LS, UT, rhomboid. Able to reproduce distal UE symptoms and gradually resolve with increased time   [facility] Adult PT Treatment:                                                DATE: 09-02-22 FOTO  41% Vitals 08/31/22:  HR 73bpm, SpO2 92-95% RA, 96/70 BP R arm Pt reports feeling dizzy due to vertigo and checked at MD yesterday ~59min rest HR 79-80bpm, BP 118/70 R arm SpO2 93-94% Symptoms resolved at this point Therapeutic Exercise: Standing using physioball for gentle flexion and scaption of bil  UE Side lying on R with AAROM with PT for shoulder trio to 90 degrees flexion, abd and ER Manual Therapy: Gentle STM L LS, cervical paraspinals, rhomboids, deltoid, infraspinatus, teres minor Posterior capsule stretch in R sidelying Gentle grade 2 mobs of GHJ of left shoulder and LAD Trigger Point Dry-Needling performed 12/17/21  by Garen Lah Treatment instructions: Expect mild to moderate muscle soreness. S/S of pneumothorax if dry needled over  a lung field, and to seek immediate medical attention should they occur. Patient verbalized understanding of these instructions and education.  Patient Consent Given: Yes Education handout provided: Previously provided Muscles treated: Left UT/LS two points with twitch response Electrical stimulation performed: No Parameters: N/A Treatment response/outcome: twitch response noted, pt noted relief                                                                                                                             PATIENT EDUCATION:  Education details: rationale for interventions, see  assessment for further education Person educated: Patient Education method: Explanation, Demonstration, Tactile cues, Verbal cues, and Handouts Education comprehension: verbalized understanding, returned demonstration, verbal cues required, tactile cues required, and needs further education    HOME EXERCISE PROGRAM: Access Code: [email or web address] URL: [email or web address] Date: 09/09/2022 Prepared by: Lulu Riding  Exercises - Seated Scapular Retraction  - 2-3 x daily - 7 x weekly - 1 sets - 8 reps - Seated Shoulder Flexion Towel Slide at Table Top  - 2-3 x daily - 7 x weekly - 1 sets - 8 reps - Scapular retraction with ER (MONEY)  - 1 x daily - 7 x weekly - 2 sets - 10 reps - Standing Cervical Retraction  - 1 x daily - 7 x weekly - 2 sets - 10 reps - Supine Chin Tuck with Towel  - 1 x daily - 7 x weekly - 2 sets - 10 reps - 5 hold - Seated Upper Trapezius Stretch (Mirrored)  - 2-3 x daily - 7 x weekly - 2 sets - 2 reps - 30 seconds hold  ASSESSMENT:  CLINICAL IMPRESSION: 09/09/2022  pt arrives to session noting 10/10 pain in her neck, shoulder with referral going down her back.  Pt declined needing to go to the ED, and didn't provide any visual indicators that would coincide with the NPRS that was reported. Continued TPDN focusing on the L upper trap today per pt report that it is where most of her pain is located.Continued IASTM techniques and trialed inhibition taping which pt noted relief of tension and improvement in cervical mobiltiy. Continued working on strengthening and updated HEP today. End of session she noted pain dropped to 8/10.   Per eval - Pt is a very pleasant 67 year old woman who arrives to PT evaluation on this date for neck and L shoulder pain. Pt reports difficulty with lifting items, reaching with LUE, and self-care/household tasks due to pain. During today's session pt demonstrates limitations in cervical and GH mobility which are likely contributing to  difficulty with aforementioned activities, LUE symptoms are reproduced by cervical extension and sidebending to L. Recommend skilled PT to address aforementioned deficits to improve functional independence/tolerance. No adverse events, symptoms are fairly irritable on exam, some mild increase in symptoms on departure but tolerates HEP relatively well. Pt departs today's session in no acute distress, all voiced questions/concerns addressed appropriately from PT perspective.  OBJECTIVE IMPAIRMENTS: decreased activity tolerance, decreased endurance, decreased mobility, decreased ROM, decreased strength, impaired UE functional use, improper body mechanics, postural dysfunction, and pain.   ACTIVITY LIMITATIONS: carrying, lifting, sitting, standing, sleeping, dressing, reach over head, and hygiene/grooming  PARTICIPATION LIMITATIONS: meal prep, cleaning, laundry, driving, and community activity  PERSONAL FACTORS: Age and Time since onset of injury/illness/exacerbation are also affecting patient's functional outcome.   REHAB POTENTIAL: [place]  CLINICAL DECISION MAKING: Stable/uncomplicated  EVALUATION COMPLEXITY: Low   GOALS: Goals reviewed with patient? No  SHORT TERM GOALS: Target date: 09/01/2022 Pt will demonstrate appropriate understanding and performance of initially prescribed HEP in order to facilitate improved independence with management of symptoms.  Baseline: HEP provided on eval 08/31/22: reports Buczkowski HEP adherence Goal status: ONGOING  2. Pt will score greater than or equal to 46 on FOTO in order to demonstrate improved perception of function due to symptoms.  Baseline: 36  08/31/22: deferred given visit 5  09/02/22: 41%   Goal status: ONGOING  LONG TERM GOALS: Target date: 09/29/2022 Pt will score 57 on FOTO in order to demonstrate improved perception of function due to symptoms. Baseline: 36 Goal status: INITIAL  2. Pt will demonstrate at least 50 degrees of active cervical  rotation ROM  bilaterally in order to demonstrate improved environmental awareness and safety with driving.  Baseline: see ROM chart above Goal status: INITIAL  3. Pt will report at least 50% decrease in overall pain levels in past week in order to facilitate improved tolerance to basic ADLs/mobility.   Baseline: 7-10/10  Goal status: INITIAL    4. Pt will report/demonstrate ability to sit/stand for up to an hour with less than 2 point increase on NPS in order to demonstrate improved tolerance to community activity. Baseline: 7-10/10 pain with typical activities Goal status: INITIAL   5. Pt will endorse at least 50% improvement in frequency of waking due to pain in order to improve quality of life and overall health.  Baseline: waking 8-9x/night due to pain  Goal status: INITIAL   6. Pt will demonstrate at least 110deg of active L shoulder flexion ROM in order to facilitate improved functional reaching and participating in daily activities.   Baseline: see ROM chart above  Goal status: INITIAL    PLAN:  PT FREQUENCY: 2x/week  PT DURATION: 8 weeks  PLANNED INTERVENTIONS: Therapeutic exercises, Therapeutic activity, Neuromuscular re-education, Balance training, Gait training, Patient/Family education, Self Care, Joint mobilization, Aquatic Therapy, Dry Needling, Electrical stimulation, Spinal mobilization, Cryotherapy, Moist heat, Taping, Manual therapy, and Re-evaluation  PLAN FOR NEXT SESSION:  Would recommend gentle manual for symptom modification as appropriate, postural stability and gentle GH/cervical mobility. review/update HEP PRN. Pt interested in possibility of more dry needling.    Deontae Robson PT, DPT, LAT, ATC  09/09/22  10:13 AM

## 2022-09-09 ENCOUNTER — Encounter: Payer: Self-pay | Admitting: Physical Therapy

## 2022-09-09 ENCOUNTER — Ambulatory Visit: Payer: 59 | Admitting: Physical Therapy

## 2022-09-09 DIAGNOSIS — R293 Abnormal posture: Secondary | ICD-10-CM

## 2022-09-09 DIAGNOSIS — M542 Cervicalgia: Secondary | ICD-10-CM

## 2022-09-09 DIAGNOSIS — M25512 Pain in left shoulder: Secondary | ICD-10-CM

## 2022-09-13 NOTE — Therapy (Signed)
OUTPATIENT PHYSICAL THERAPY TREATMENT NOTE   Patient Name: Mindy Stevens MRN: [id number] DOB:1955-10-02, 67 y.o., female Today's Date: 09/14/2022  END OF SESSION:  PT End of Session - 09/14/22 1101     Visit Number 9    Number of Visits 17    Date for PT Re-Evaluation 09/29/22    Authorization Type [employer]    Authorization Time Period no needed  auth per appt notes    Progress Note Due on Visit 10    PT Start Time 1101    PT Stop Time 1140    PT Time Calculation (min) 39 min    Activity Tolerance Patient limited by pain;No increased pain    Behavior During Therapy WFL for tasks assessed/performed                     Past Medical History:  Diagnosis Date   GERD (gastroesophageal reflux disease)    Hyperlipidemia    History reviewed. No pertinent surgical history. Patient Active Problem List   Diagnosis Date Noted   Other spondylosis with radiculopathy, cervical region 07/13/2022   Impingement syndrome of left shoulder 04/26/2022   Dyspnea 12/19/2020    PCP: Dot Been, FNP  REFERRING PROVIDER: Eldred Manges, MD  REFERRING DIAG: [id number] (ICD-10-CM) - Chronic left shoulder pain M47.812 (ICD-10-CM) - Cervical spondylosis  THERAPY DIAG:  Cervicalgia  Abnormal posture  Left shoulder pain, unspecified chronicity  Rationale for Evaluation and Treatment: Rehabilitation  ONSET DATE: 2-3 months for weakness, neck/shoulder pain for years   SUBJECTIVE:                                                                                                                                                                                Per eval - Pt endorses history of difficulty with lifting items, difficulty washing and doing hair. Pt endorses chronic BIL shoulder pain, R used to be worse but responded well to an injection. Developed similar pain in L shoulder which she states has not really responded to injections. Symptoms worsen with activity and prolonged  positioning. Wakes up frequently throughout the night due to pain (~8-9x/night). States symptoms refer down LUE towards wrist, no N/T. Does describe some L hand weakness. Endorses chronic headaches in mornings that resolve after a bit of time/movement, non worsening over time.  Hand dominance: Right  SUBJECTIVE STATEMENT: 09/14/2022 Pt states needling was not as helpful last time, had some increased pain after. Also didn't feel taping was helpful. States overall symptoms are more irritable today due to the weather but no significant changes since start of care for better or worse.     PERTINENT HISTORY:  GERD, pt also  endorses hx of fibromyalgia  PAIN:  Are you having pain: 10/10  Location/description: L anterior shoulder   Per eval -  Best-worst over past week: 7-10/10  - aggravating factors: standing/sitting 30-19min, lifting items, doing hair, self care - Easing factors: medication, cervical sidebending  towards R  PRECAUTIONS: None  WEIGHT BEARING RESTRICTIONS: No  FALLS:  Has patient fallen in last 6 months? Yes. Number of falls 1; beginning of Spring this year, fell in her sisters yard, had some ankle pain but denies other injury, states she followed up with her doctor afterwards   LIVING ENVIRONMENT: Lives alone in apartment, no STE. Son and his girlfriend check in on her daily Pt states family helps with heavier activities around the apartment  OCCUPATION: not working currently  PLOF: Independent - family assist for heavier activities around the home  PATIENT GOALS: get back to praise dancing, very involved in church  NEXT MD VISIT: TBD   OBJECTIVE: (objective measures completed at initial evaluation unless otherwise dated)   DIAGNOSTIC FINDINGS:  L shoulder MRI 07/01/22, below per EPIC: "IMPRESSION: Moderate tendinosis of the distal supraspinatus and infraspinatus tendons with articular sided fraying and low-grade bursal sided tearing near the footprint, and focal  calcification proximal to the footprint compatible with calcific tendinosis. No retracted cuff tear. No significant muscle atrophy.   Mild subacromial-subdeltoid bursitis.   Moderate glenohumeral osteoarthritis. Degenerative posterior and superior labral fraying.   Mild AC joint arthropathy."  Cervical XR 11/26/21 "IMPRESSION: Moderate multilevel degenerative changes of the cervical spine. No acute fracture or malalignment."  PATIENT SURVEYS:  FOTO 36 current, 57 predicted 09-02-22  41%  COGNITION: Overall cognitive status: Within functional limits for tasks assessed  SENSATION: NT on eval 08/11/22: Light touch intact B UE aside from mild reduction C6 on L side. Negative hoffmans/tromner signs BIL UE   POSTURE: forward head, elevated B UT, rounded shoulders, holding head slightly towards R, LUE guarded  PALPATION: Deferred given time constraints  08/11/22: concordant tenderness/pain with L LS/UT, rhomboids, trigger point in infraspinatus. Reproduction of UE symptoms with trigger points in infraspinatus and levator scap  CERVICAL ROM:   Active ROM A/PROM (deg) eval AROM 09/01/22  Flexion 100% no pain   Extension 75% painful in UE to elbow   Right lateral flexion 50% **   Left lateral flexion 75% *   Right rotation 52 deg 52 deg  Left rotation 20 deg * 40 deg *   (Blank rows = not tested) (Key: WFL = within functional limits not formally assessed, * = concordant pain, s = stiffness/stretching sensation, NT = not tested)   UPPER EXTREMITY ROM:  Active ROM Right eval Left eval Left 08/31/22  Shoulder flexion 145 deg 68 deg * 82 deg *  Shoulder abduction 92 deg 48 deg * 50 deg *  Shoulder internal rotation (functional combo)  Greater trochanter * Superior glute *  Shoulder external rotation     Elbow flexion     Elbow extension     Wrist flexion     Wrist extension      (Blank rows = not tested) (Key: WFL = within functional limits not formally assessed, * =  concordant pain, s = stiffness/stretching sensation, NT = not tested)  Comments:   UPPER EXTREMITY MMT:  MMT Right eval Left eval  Shoulder flexion    Shoulder extension    Shoulder abduction    Shoulder extension    Shoulder internal rotation    Shoulder external rotation  Elbow flexion    Elbow extension    Grip strength    (Blank rows = not tested)  (Key: WFL = within functional limits not formally assessed, * = concordant pain, s = stiffness/stretching sensation, NT = not tested)  Comments: deferred given symptom irritability  CERVICAL SPECIAL TESTS:  Deferred given symptom irritability although sidebending towards L and cervical extension do provoke LUE symptoms  FUNCTIONAL TESTS:  Overhead and IR functional reach testing significantly limited on L compared to R   TODAY'S TREATMENT:  [facility] Adult PT Treatment:                                                DATE: 09/14/22 Therapeutic Exercise: Standing swiss ball flexion at table x12  Supine chin tuck x8 cues for comfortable ROM  Seated double ER + scap retraction unweighted 2x8 cues for form and comfortable ROM  Seated scap retraction x12 cues for posture  Manual Therapy: Seated; Gentle STM (open palm technique) to biceps, lateral pec, anterior delt, LS, UT, rhomboid. Most relief from STM to periscapular musculature, most irritability in bicep/deltoid   [facility] Adult PT Treatment:                                                DATE: 09/09/2022 BP R arm in sitting 116/56 O2 94%  Pulse 76   Therapeutic Exercise: Supine chin tuck 1 x 10 holding 10 second ea Seated scapular retraction 1 x 15 verbal cues to avoid shoulder hiking Seated L upper trap stretch 2 x 30 sec Seated scapular retraction with ER 2 x 10 with RTB  Updated HEP today for seated scapular retraction with ER, upper trap stretch, seated and supine chin tuck Manual Therapy: IASTM along the L upper trap Inhibition taping for the L upper  trap Sub-occipital release  Trigger Point Dry-Needling  Treatment instructions: Expect mild to moderate muscle soreness. S/S of pneumothorax if dry needled over a lung field, and to seek immediate medical attention should they occur. Patient verbalized understanding of these instructions and education.  Patient Consent Given: No Education handout provided: Previously provided Muscles treated: L upper trap Electrical stimulation performed: No Parameters:  N/A Treatment response/outcome: twitch response, pt noted relief of tension    [facility] Adult PT Treatment:                                                DATE: 09/07/22 Therapeutic Exercise: Scap retraction + double ER (unweighted) 3x5 cues for posture and comfortable ROM  Scaption/flexion with physioball on table x8 each cues for comfortable ROM Swiss ball press down at table 2x10 cues for breath control  HEP review Manual Therapy: Seated; Gentle STM (open palm technique) to biceps, lateral pec, anterior delt, LS, UT, rhomboid. Able to reproduce distal UE symptoms and gradually resolve with increased time   [facility] Adult PT Treatment:  DATE: 09-02-22 FOTO  41% Vitals 08/31/22:  HR 73bpm, SpO2 92-95% RA, 96/70 BP R arm Pt reports feeling dizzy due to vertigo and checked at MD yesterday ~49min rest HR 79-80bpm, BP 118/70 R arm SpO2 93-94% Symptoms resolved at this point Therapeutic Exercise: Standing using physioball for gentle flexion and scaption of bil  UE Side lying on R with AAROM with PT for shoulder trio to 90 degrees flexion, abd and ER Manual Therapy: Gentle STM L LS, cervical paraspinals, rhomboids, deltoid, infraspinatus, teres minor Posterior capsule stretch in R sidelying Gentle grade 2 mobs of GHJ of left shoulder and LAD Trigger Point Dry-Needling performed 12/17/21  by Garen Lah Treatment instructions: Expect mild to moderate muscle soreness. S/S of pneumothorax if dry  needled over a lung field, and to seek immediate medical attention should they occur. Patient verbalized understanding of these instructions and education.  Patient Consent Given: Yes Education handout provided: Previously provided Muscles treated: Left UT/LS two points with twitch response Electrical stimulation performed: No Parameters: N/A Treatment response/outcome: twitch response noted, pt noted relief                                                                                                                             PATIENT EDUCATION:  Education details: rationale for interventions, see assessment for further education Person educated: Patient Education method: Explanation, Demonstration, Tactile cues, Verbal cues, and Handouts Education comprehension: verbalized understanding, returned demonstration, verbal cues required, tactile cues required, and needs further education    HOME EXERCISE PROGRAM: Access Code: [email or web address] URL: [email or web address] Date: 09/09/2022 Prepared by: Lulu Riding  Exercises - Seated Scapular Retraction  - 2-3 x daily - 7 x weekly - 1 sets - 8 reps - Seated Shoulder Flexion Towel Slide at Table Top  - 2-3 x daily - 7 x weekly - 1 sets - 8 reps - Scapular retraction with ER (MONEY)  - 1 x daily - 7 x weekly - 2 sets - 10 reps - Standing Cervical Retraction  - 1 x daily - 7 x weekly - 2 sets - 10 reps - Supine Chin Tuck with Towel  - 1 x daily - 7 x weekly - 2 sets - 10 reps - 5 hold - Seated Upper Trapezius Stretch (Mirrored)  - 2-3 x daily - 7 x weekly - 2 sets - 2 reps - 30 seconds hold  ASSESSMENT:  CLINICAL IMPRESSION: 09/14/2022  Pt arrives w/ 10/10 pain, endorses soreness/pain after last session that has returned to baseline. Continues to respond well to gentle STM as above with reduction in pain to 8/10. Continues to have fairly high symptom irritability with movement, most difficulty with chin tucks which provokes  anterior shoulder pain bilaterally, resolves with rest. Pt reports gradual increase in L anterior shoulder pain with activity, requests a bit more manual at end of session to mitigate increase in pain. Some transient relief but overall states she feels about the same as on  arrival. With next session as visit 10, recommend progress note/assessment and if no notable change at that point would likely be appropriate to place PT on hold to follow up with provider. No adverse events, pt denies any change in symptoms on departure. Pt departs today's session in no acute distress, all voiced questions/concerns addressed appropriately from PT perspective.    Per eval - Pt is a very pleasant 68 year old woman who arrives to PT evaluation on this date for neck and L shoulder pain. Pt reports difficulty with lifting items, reaching with LUE, and self-care/household tasks due to pain. During today's session pt demonstrates limitations in cervical and GH mobility which are likely contributing to difficulty with aforementioned activities, LUE symptoms are reproduced by cervical extension and sidebending to L. Recommend skilled PT to address aforementioned deficits to improve functional independence/tolerance. No adverse events, symptoms are fairly irritable on exam, some mild increase in symptoms on departure but tolerates HEP relatively well. Pt departs today's session in no acute distress, all voiced questions/concerns addressed appropriately from PT perspective.    OBJECTIVE IMPAIRMENTS: decreased activity tolerance, decreased endurance, decreased mobility, decreased ROM, decreased strength, impaired UE functional use, improper body mechanics, postural dysfunction, and pain.   ACTIVITY LIMITATIONS: carrying, lifting, sitting, standing, sleeping, dressing, reach over head, and hygiene/grooming  PARTICIPATION LIMITATIONS: meal prep, cleaning, laundry, driving, and community activity  PERSONAL FACTORS: Age and Time since  onset of injury/illness/exacerbation are also affecting patient's functional outcome.   REHAB POTENTIAL: [place]  CLINICAL DECISION MAKING: Stable/uncomplicated  EVALUATION COMPLEXITY: Low   GOALS: Goals reviewed with patient? No  SHORT TERM GOALS: Target date: 09/01/2022 Pt will demonstrate appropriate understanding and performance of initially prescribed HEP in order to facilitate improved independence with management of symptoms.  Baseline: HEP provided on eval 08/31/22: reports [facility] HEP adherence Goal status: ONGOING  2. Pt will score greater than or equal to 46 on FOTO in order to demonstrate improved perception of function due to symptoms.  Baseline: 36  08/31/22: deferred given visit 5  09/02/22: 41%   Goal status: ONGOING  LONG TERM GOALS: Target date: 09/29/2022 Pt will score 57 on FOTO in order to demonstrate improved perception of function due to symptoms. Baseline: 36 Goal status: INITIAL  2. Pt will demonstrate at least 50 degrees of active cervical rotation ROM  bilaterally in order to demonstrate improved environmental awareness and safety with driving.  Baseline: see ROM chart above Goal status: INITIAL  3. Pt will report at least 50% decrease in overall pain levels in past week in order to facilitate improved tolerance to basic ADLs/mobility.   Baseline: 7-10/10  Goal status: INITIAL    4. Pt will report/demonstrate ability to sit/stand for up to an hour with less than 2 point increase on NPS in order to demonstrate improved tolerance to community activity. Baseline: 7-10/10 pain with typical activities Goal status: INITIAL   5. Pt will endorse at least 50% improvement in frequency of waking due to pain in order to improve quality of life and overall health.  Baseline: waking 8-9x/night due to pain  Goal status: INITIAL   6. Pt will demonstrate at least 110deg of active L shoulder flexion ROM in order to facilitate improved functional reaching and participating  in daily activities.   Baseline: see ROM chart above  Goal status: INITIAL    PLAN:  PT FREQUENCY: 2x/week  PT DURATION: 8 weeks  PLANNED INTERVENTIONS: Therapeutic exercises, Therapeutic activity, Neuromuscular re-education, Balance training, Gait training, Patient/Family  education, Self Care, Joint mobilization, Aquatic Therapy, Dry Needling, Electrical stimulation, Spinal mobilization, Cryotherapy, Moist heat, Taping, Manual therapy, and Re-evaluation  PLAN FOR NEXT SESSION:  progress note assessment. If no significant change, would likely be appropriate to place on hold until able to follow with provider.    Ashley Murrain PT, DPT 09/14/2022 11:47 AM

## 2022-09-14 ENCOUNTER — Ambulatory Visit: Payer: 59 | Admitting: Orthopaedic Surgery

## 2022-09-14 ENCOUNTER — Encounter: Payer: Self-pay | Admitting: Physical Therapy

## 2022-09-14 ENCOUNTER — Ambulatory Visit: Payer: 59 | Admitting: Physical Therapy

## 2022-09-14 DIAGNOSIS — M25512 Pain in left shoulder: Secondary | ICD-10-CM

## 2022-09-14 DIAGNOSIS — M542 Cervicalgia: Secondary | ICD-10-CM

## 2022-09-14 DIAGNOSIS — R293 Abnormal posture: Secondary | ICD-10-CM

## 2022-09-15 NOTE — Therapy (Signed)
OUTPATIENT PHYSICAL THERAPY TREATMENT NOTE   Patient Name: Mindy Stevens MRN: [id number] DOB:12-01-1955, 67 y.o., female Today's Date: 09/15/2022  END OF SESSION:            Past Medical History:  Diagnosis Date   GERD (gastroesophageal reflux disease)    Hyperlipidemia    No past surgical history on file. Patient Active Problem List   Diagnosis Date Noted   Other spondylosis with radiculopathy, cervical region 07/13/2022   Impingement syndrome of left shoulder 04/26/2022   Dyspnea 12/19/2020    PCP: Dot Been, FNP  REFERRING PROVIDER: Eldred Manges, MD  REFERRING DIAG: [phone] (ICD-10-CM) - Chronic left shoulder pain M47.812 (ICD-10-CM) - Cervical spondylosis  THERAPY DIAG:  No diagnosis found.  Rationale for Evaluation and Treatment: Rehabilitation  ONSET DATE: 2-3 months for weakness, neck/shoulder pain for years   SUBJECTIVE:                                                                                                                                                                                Per eval - Pt endorses history of difficulty with lifting items, difficulty washing and doing hair. Pt endorses chronic BIL shoulder pain, R used to be worse but responded well to an injection. Developed similar pain in L shoulder which she states has not really responded to injections. Symptoms worsen with activity and prolonged positioning. Wakes up frequently throughout the night due to pain (~8-9x/night). States symptoms refer down LUE towards wrist, no N/T. Does describe some L hand weakness. Endorses chronic headaches in mornings that resolve after a bit of time/movement, non worsening over time.  Hand dominance: Right  SUBJECTIVE STATEMENT: 09/15/2022 ***  *** Pt states needling was not as helpful last time, had some increased pain after. Also didn't feel taping was helpful. States overall symptoms are more irritable today due to the weather but no  significant changes since start of care for better or worse.     PERTINENT HISTORY:  GERD, pt also endorses hx of fibromyalgia  PAIN:  Are you having pain: 10/10  Location/description: L anterior shoulder   Per eval -  Best-worst over past week: 7-10/10  - aggravating factors: standing/sitting 30-16min, lifting items, doing hair, self care - Easing factors: medication, cervical sidebending  towards R  PRECAUTIONS: None  WEIGHT BEARING RESTRICTIONS: No  FALLS:  Has patient fallen in last 6 months? Yes. Number of falls 1; beginning of Spring this year, fell in her sisters yard, had some ankle pain but denies other injury, states she followed up with her doctor afterwards   LIVING ENVIRONMENT: Lives alone in apartment, no STE. Son and his girlfriend check in on  her daily Pt states family helps with heavier activities around the apartment  OCCUPATION: not working currently  PLOF: Independent - family assist for heavier activities around the home  PATIENT GOALS: get back to praise dancing, very involved in church  NEXT MD VISIT: TBD   OBJECTIVE: (objective measures completed at initial evaluation unless otherwise dated)   DIAGNOSTIC FINDINGS:  L shoulder MRI 07/01/22, below per EPIC: "IMPRESSION: Moderate tendinosis of the distal supraspinatus and infraspinatus tendons with articular sided fraying and low-grade bursal sided tearing near the footprint, and focal calcification proximal to the footprint compatible with calcific tendinosis. No retracted cuff tear. No significant muscle atrophy.   Mild subacromial-subdeltoid bursitis.   Moderate glenohumeral osteoarthritis. Degenerative posterior and superior labral fraying.   Mild AC joint arthropathy."  Cervical XR 11/26/21 "IMPRESSION: Moderate multilevel degenerative changes of the cervical spine. No acute fracture or malalignment."  PATIENT SURVEYS:  FOTO 36 current, 57 predicted 09-02-22   41%  COGNITION: Overall cognitive status: Within functional limits for tasks assessed  SENSATION: NT on eval 08/11/22: Light touch intact B UE aside from mild reduction C6 on L side. Negative hoffmans/tromner signs BIL UE   POSTURE: forward head, elevated B UT, rounded shoulders, holding head slightly towards R, LUE guarded  PALPATION: Deferred given time constraints  08/11/22: concordant tenderness/pain with L LS/UT, rhomboids, trigger point in infraspinatus. Reproduction of UE symptoms with trigger points in infraspinatus and levator scap  CERVICAL ROM:   Active ROM A/PROM (deg) eval AROM 09/01/22  Flexion 100% no pain   Extension 75% painful in UE to elbow   Right lateral flexion 50% **   Left lateral flexion 75% *   Right rotation 52 deg 52 deg  Left rotation 20 deg * 40 deg *   (Blank rows = not tested) (Key: WFL = within functional limits not formally assessed, * = concordant pain, s = stiffness/stretching sensation, NT = not tested)   UPPER EXTREMITY ROM:  Active ROM Right eval Left eval Left 08/31/22  Shoulder flexion 145 deg 68 deg * 82 deg *  Shoulder abduction 92 deg 48 deg * 50 deg *  Shoulder internal rotation (functional combo)  Greater trochanter * Superior glute *  Shoulder external rotation     Elbow flexion     Elbow extension     Wrist flexion     Wrist extension      (Blank rows = not tested) (Key: WFL = within functional limits not formally assessed, * = concordant pain, s = stiffness/stretching sensation, NT = not tested)  Comments:   UPPER EXTREMITY MMT:  MMT Right eval Left eval  Shoulder flexion    Shoulder extension    Shoulder abduction    Shoulder extension    Shoulder internal rotation    Shoulder external rotation    Elbow flexion    Elbow extension    Grip strength    (Blank rows = not tested)  (Key: WFL = within functional limits not formally assessed, * = concordant pain, s = stiffness/stretching sensation, NT = not tested)   Comments: deferred given symptom irritability  CERVICAL SPECIAL TESTS:  Deferred given symptom irritability although sidebending towards L and cervical extension do provoke LUE symptoms  FUNCTIONAL TESTS:  Overhead and IR functional reach testing significantly limited on L compared to R   TODAY'S TREATMENT:  [facility] Adult PT Treatment:  DATE: 09/16/22 Therapeutic Exercise: *** Manual Therapy: *** Neuromuscular re-ed: *** Therapeutic Activity: *** Modalities: *** Self Care: ***    [facility] Adult PT Treatment:                                                DATE: 09/14/22 Therapeutic Exercise: Standing swiss ball flexion at table x12  Supine chin tuck x8 cues for comfortable ROM  Seated double ER + scap retraction unweighted 2x8 cues for form and comfortable ROM  Seated scap retraction x12 cues for posture  Manual Therapy: Seated; Gentle STM (open palm technique) to biceps, lateral pec, anterior delt, LS, UT, rhomboid. Most relief from STM to periscapular musculature, most irritability in bicep/deltoid   [facility] Adult PT Treatment:                                                DATE: 09/09/2022 BP R arm in sitting 116/56 O2 94%  Pulse 76   Therapeutic Exercise: Supine chin tuck 1 x 10 holding 10 second ea Seated scapular retraction 1 x 15 verbal cues to avoid shoulder hiking Seated L upper trap stretch 2 x 30 sec Seated scapular retraction with ER 2 x 10 with RTB  Updated HEP today for seated scapular retraction with ER, upper trap stretch, seated and supine chin tuck Manual Therapy: IASTM along the L upper trap Inhibition taping for the L upper trap Sub-occipital release  Trigger Point Dry-Needling  Treatment instructions: Expect mild to moderate muscle soreness. S/S of pneumothorax if dry needled over a lung field, and to seek immediate medical attention should they occur. Patient verbalized understanding of these instructions  and education.  Patient Consent Given: No Education handout provided: Previously provided Muscles treated: L upper trap Electrical stimulation performed: No Parameters:  N/A Treatment response/outcome: twitch response, pt noted relief of tension    [facility] Adult PT Treatment:                                                DATE: 09/07/22 Therapeutic Exercise: Scap retraction + double ER (unweighted) 3x5 cues for posture and comfortable ROM  Scaption/flexion with physioball on table x8 each cues for comfortable ROM Swiss ball press down at table 2x10 cues for breath control  HEP review Manual Therapy: Seated; Gentle STM (open palm technique) to biceps, lateral pec, anterior delt, LS, UT, rhomboid. Able to reproduce distal UE symptoms and gradually resolve with increased time   [facility] Adult PT Treatment:                                                DATE: 09-02-22 FOTO  41% Vitals 08/31/22:  HR 73bpm, SpO2 92-95% RA, 96/70 BP R arm Pt reports feeling dizzy due to vertigo and checked at MD yesterday ~30min rest HR 79-80bpm, BP 118/70 R arm SpO2 93-94% Symptoms resolved at this point Therapeutic Exercise: Standing using physioball for gentle flexion and scaption of bil  UE  Side lying on R with AAROM with PT for shoulder trio to 90 degrees flexion, abd and ER Manual Therapy: Gentle STM L LS, cervical paraspinals, rhomboids, deltoid, infraspinatus, teres minor Posterior capsule stretch in R sidelying Gentle grade 2 mobs of GHJ of left shoulder and LAD Trigger Point Dry-Needling performed 12/17/21  by Garen Lah Treatment instructions: Expect mild to moderate muscle soreness. S/S of pneumothorax if dry needled over a lung field, and to seek immediate medical attention should they occur. Patient verbalized understanding of these instructions and education.  Patient Consent Given: Yes Education handout provided: Previously provided Muscles treated: Left UT/LS two points with twitch  response Electrical stimulation performed: No Parameters: N/A Treatment response/outcome: twitch response noted, pt noted relief                                                                                                                             PATIENT EDUCATION:  Education details: rationale for interventions, see assessment for further education Person educated: Patient Education method: Explanation, Demonstration, Tactile cues, Verbal cues, and Handouts Education comprehension: verbalized understanding, returned demonstration, verbal cues required, tactile cues required, and needs further education    HOME EXERCISE PROGRAM: Access Code: [email or web address] URL: [email or web address] Date: 09/09/2022 Prepared by: Lulu Riding  Exercises - Seated Scapular Retraction  - 2-3 x daily - 7 x weekly - 1 sets - 8 reps - Seated Shoulder Flexion Towel Slide at Table Top  - 2-3 x daily - 7 x weekly - 1 sets - 8 reps - Scapular retraction with ER (MONEY)  - 1 x daily - 7 x weekly - 2 sets - 10 reps - Standing Cervical Retraction  - 1 x daily - 7 x weekly - 2 sets - 10 reps - Supine Chin Tuck with Towel  - 1 x daily - 7 x weekly - 2 sets - 10 reps - 5 hold - Seated Upper Trapezius Stretch (Mirrored)  - 2-3 x daily - 7 x weekly - 2 sets - 2 reps - 30 seconds hold  ASSESSMENT:  CLINICAL IMPRESSION: 09/15/2022 ***  *** Pt arrives w/ 10/10 pain, endorses soreness/pain after last session that has returned to baseline. Continues to respond well to gentle STM as above with reduction in pain to 8/10. Continues to have fairly high symptom irritability with movement, most difficulty with chin tucks which provokes anterior shoulder pain bilaterally, resolves with rest. Pt reports gradual increase in L anterior shoulder pain with activity, requests a bit more manual at end of session to mitigate increase in pain. Some transient relief but overall states she feels about the same as on arrival.  With next session as visit 10, recommend progress note/assessment and if no notable change at that point would likely be appropriate to place PT on hold to follow up with provider. No adverse events, pt denies any change in symptoms on departure. Pt departs today's session in no acute distress, all voiced questions/concerns addressed appropriately  from PT perspective.    Per eval - Pt is a very pleasant 67 year old woman who arrives to PT evaluation on this date for neck and L shoulder pain. Pt reports difficulty with lifting items, reaching with LUE, and self-care/household tasks due to pain. During today's session pt demonstrates limitations in cervical and GH mobility which are likely contributing to difficulty with aforementioned activities, LUE symptoms are reproduced by cervical extension and sidebending to L. Recommend skilled PT to address aforementioned deficits to improve functional independence/tolerance. No adverse events, symptoms are fairly irritable on exam, some mild increase in symptoms on departure but tolerates HEP relatively well. Pt departs today's session in no acute distress, all voiced questions/concerns addressed appropriately from PT perspective.    OBJECTIVE IMPAIRMENTS: decreased activity tolerance, decreased endurance, decreased mobility, decreased ROM, decreased strength, impaired UE functional use, improper body mechanics, postural dysfunction, and pain.   ACTIVITY LIMITATIONS: carrying, lifting, sitting, standing, sleeping, dressing, reach over head, and hygiene/grooming  PARTICIPATION LIMITATIONS: meal prep, cleaning, laundry, driving, and community activity  PERSONAL FACTORS: Age and Time since onset of injury/illness/exacerbation are also affecting patient's functional outcome.   REHAB POTENTIAL: [place]  CLINICAL DECISION MAKING: Stable/uncomplicated  EVALUATION COMPLEXITY: Low   GOALS: Goals reviewed with patient? No  SHORT TERM GOALS: Target date:  09/01/2022 Pt will demonstrate appropriate understanding and performance of initially prescribed HEP in order to facilitate improved independence with management of symptoms.  Baseline: HEP provided on eval 08/31/22: reports [place] HEP adherence Goal status: ONGOING  2. Pt will score greater than or equal to 46 on FOTO in order to demonstrate improved perception of function due to symptoms.  Baseline: 36  08/31/22: deferred given visit 5  09/02/22: 41%   Goal status: ONGOING  LONG TERM GOALS: Target date: 09/29/2022 Pt will score 57 on FOTO in order to demonstrate improved perception of function due to symptoms. Baseline: 36 Goal status: INITIAL  2. Pt will demonstrate at least 50 degrees of active cervical rotation ROM  bilaterally in order to demonstrate improved environmental awareness and safety with driving.  Baseline: see ROM chart above Goal status: INITIAL  3. Pt will report at least 50% decrease in overall pain levels in past week in order to facilitate improved tolerance to basic ADLs/mobility.   Baseline: 7-10/10  Goal status: INITIAL    4. Pt will report/demonstrate ability to sit/stand for up to an hour with less than 2 point increase on NPS in order to demonstrate improved tolerance to community activity. Baseline: 7-10/10 pain with typical activities Goal status: INITIAL   5. Pt will endorse at least 50% improvement in frequency of waking due to pain in order to improve quality of life and overall health.  Baseline: waking 8-9x/night due to pain  Goal status: INITIAL   6. Pt will demonstrate at least 110deg of active L shoulder flexion ROM in order to facilitate improved functional reaching and participating in daily activities.   Baseline: see ROM chart above  Goal status: INITIAL    PLAN:  PT FREQUENCY: 2x/week  PT DURATION: 8 weeks  PLANNED INTERVENTIONS: Therapeutic exercises, Therapeutic activity, Neuromuscular re-education, Balance training, Gait training,  Patient/Family education, Self Care, Joint mobilization, Aquatic Therapy, Dry Needling, Electrical stimulation, Spinal mobilization, Cryotherapy, Moist heat, Taping, Manual therapy, and Re-evaluation  PLAN FOR NEXT SESSION:  progress note assessment. If no significant change, would likely be appropriate to place on hold until able to follow with provider. ***    Ashley Murrain PT,  DPT 09/15/2022 9:00 AM

## 2022-09-16 ENCOUNTER — Encounter: Payer: Self-pay | Admitting: Orthopedic Surgery

## 2022-09-16 ENCOUNTER — Ambulatory Visit: Payer: Self-pay

## 2022-09-16 ENCOUNTER — Ambulatory Visit (INDEPENDENT_AMBULATORY_CARE_PROVIDER_SITE_OTHER): Payer: 59 | Admitting: Orthopedic Surgery

## 2022-09-16 ENCOUNTER — Encounter: Payer: Self-pay | Admitting: Physical Therapy

## 2022-09-16 ENCOUNTER — Ambulatory Visit: Payer: 59 | Admitting: Physical Therapy

## 2022-09-16 DIAGNOSIS — M19012 Primary osteoarthritis, left shoulder: Secondary | ICD-10-CM | POA: Diagnosis not present

## 2022-09-16 DIAGNOSIS — M25512 Pain in left shoulder: Secondary | ICD-10-CM

## 2022-09-16 DIAGNOSIS — G8929 Other chronic pain: Secondary | ICD-10-CM

## 2022-09-16 DIAGNOSIS — R293 Abnormal posture: Secondary | ICD-10-CM | POA: Insufficient documentation

## 2022-09-16 DIAGNOSIS — M542 Cervicalgia: Secondary | ICD-10-CM | POA: Insufficient documentation

## 2022-09-16 MED ORDER — BUPIVACAINE HCL 0.5 % IJ SOLN
9.0000 mL | INTRAMUSCULAR | Status: AC | PRN
  Administered 2022-09-16: 9 mL via INTRA_ARTICULAR

## 2022-09-16 MED ORDER — METHYLPREDNISOLONE ACETATE 40 MG/ML IJ SUSP
40.0000 mg | INTRAMUSCULAR | Status: AC | PRN
  Administered 2022-09-16: 40 mg via INTRA_ARTICULAR

## 2022-09-16 MED ORDER — LIDOCAINE HCL 1 % IJ SOLN
5.0000 mL | INTRAMUSCULAR | Status: AC | PRN
  Administered 2022-09-16: 5 mL

## 2022-09-16 NOTE — Progress Notes (Signed)
Office Visit Note   Patient: Mindy Stevens           Date of Birth: 18-Dec-1955           MRN: [id number] Visit Date: 09/16/2022 Requested by: Dot Been, FNP [address] [place],  [place] [address] PCP: Dot Been, FNP  Subjective: Chief Complaint  Patient presents with   Left Shoulder - Pain    HPI: Mindy Stevens is a 67 y.o. female who presents to the office reporting left shoulder pain which has been going on for years.  Did have an injection in the glenohumeral joint in December 2023 which gave her [place] relief.  She is right-hand dominant.  Reports constant pain.  Pain wakes her from sleep at night.  Radiates down the arm but not below the elbow.  Has some occasional numbness and tingling in the hand.  Also reports mild neck pain.  Currently in physical therapy without relief..                ROS: All systems reviewed are negative as they relate to the chief complaint within the history of present illness.  Patient denies fevers or chills.  Assessment & Plan: Visit Diagnoses:  1. Chronic left shoulder pain     Plan: Impression is left shoulder arthritis.  She does have some partial-thickness rotator cuff tearing along with inferior humeral head osteophyte on MRI scanning.  Surgery not indicated for this rotator cuff problem as her biggest pain generator is likely the arthritis.  Did have [place] result for 3 months with the prior injection in December 2023.  Repeat injection performed today.  She should come back in about 5 months for repeat evaluation of that left shoulder.  She may need shoulder replacement in the form of reverse replacement sometime in the future.  Follow-Up Instructions: No follow-ups on file.   Orders:  Orders Placed This Encounter  Procedures   US Guided Needle Placement - No Linked Charges   No orders of the defined types were placed in this encounter.     Procedures: Large Joint Inj: L glenohumeral on 09/16/2022 10:32 AM Indications:  diagnostic evaluation and pain Details: 22 G 1.5 in needle, ultrasound-guided posterior approach  Arthrogram: No  Medications: 9 mL bupivacaine 0.5 %; 40 mg methylPREDNISolone acetate 40 MG/ML; 5 mL lidocaine 1 % Outcome: tolerated well, no immediate complications Procedure, treatment alternatives, risks and benefits explained, specific risks discussed. Consent was given by the patient. Immediately prior to procedure a time out was called to verify the correct patient, procedure, equipment, support staff and site/side marked as required. Patient was prepped and draped in the usual sterile fashion.       Clinical Data: No additional findings.  Objective: Vital Signs: There were no vitals taken for this visit.  Physical Exam:  Constitutional: Patient appears well-developed HEENT:  Head: Normocephalic Eyes:EOM are normal Neck: Normal range of motion Cardiovascular: Normal rate Pulmonary/chest: Effort normal Neurologic: Patient is alert Skin: Skin is warm Psychiatric: Patient has normal mood and affect  Ortho Exam: Ortho exam demonstrates [place] rotator cuff strength of supraspinatus and infraspinatus testing on the left.  She has forward flexion and abduction on the right above 90 degrees.  On the left active forward flexion is about 80 degrees and active abduction is about 70 degrees.  Rotator cuff strength to infraspinatus and subscap testing is 5 out of 5.  Deltoid is functional.  No coarse grinding or crepitus with  internal/external rotation of that left shoulder at 90 degrees of abduction.  Specialty Comments:  No specialty comments available.  Imaging: No results found.   PMFS History: Patient Active Problem List   Diagnosis Date Noted   Other spondylosis with radiculopathy, cervical region 07/13/2022   Impingement syndrome of left shoulder 04/26/2022   Dyspnea 12/19/2020   Past Medical History:  Diagnosis Date   GERD (gastroesophageal reflux disease)     Hyperlipidemia     Family History  Problem Relation Age of Onset   Breast cancer Paternal Aunt     No past surgical history on file. Social History   Occupational History   Not on file  Tobacco Use   Smoking status: Former   Smokeless tobacco: Never  Vaping Use   Vaping status: Never Used  Substance and Sexual Activity   Alcohol use: No   Drug use: No   Sexual activity: Never

## 2022-09-29 ENCOUNTER — Telehealth: Payer: Self-pay | Admitting: Physical Therapy

## 2022-09-29 ENCOUNTER — Ambulatory Visit: Payer: 59 | Admitting: Physical Therapy

## 2022-09-29 NOTE — Therapy (Signed)
OUTPATIENT PHYSICAL THERAPY PROGRESS NOTE ***    Patient Name: Mindy Stevens MRN: [id number] DOB:[birth year], 67 y.o., female Today's Date: 09/29/2022  END OF SESSION:             Past Medical History:  Diagnosis Date   GERD (gastroesophageal reflux disease)    Hyperlipidemia    No past surgical history on file. Patient Active Problem List   Diagnosis Date Noted   Other spondylosis with radiculopathy, cervical region 07/13/2022   Impingement syndrome of left shoulder 04/26/2022   Dyspnea 12/19/2020    PCP: Dot Been, FNP  REFERRING PROVIDER: Eldred Manges, MD  REFERRING DIAG: [id number] (ICD-10-CM) - Chronic left shoulder pain M47.812 (ICD-10-CM) - Cervical spondylosis  THERAPY DIAG:  No diagnosis found.  Rationale for Evaluation and Treatment: Rehabilitation  ONSET DATE: 2-3 months for weakness, neck/shoulder pain for years   SUBJECTIVE:                                                                                                                                                                                Per eval - Pt endorses history of difficulty with lifting items, difficulty washing and doing hair. Pt endorses chronic BIL shoulder pain, R used to be worse but responded well to an injection. Developed similar pain in L shoulder which she states has not really responded to injections. Symptoms worsen with activity and prolonged positioning. Wakes up frequently throughout the night due to pain (~8-9x/night). States symptoms refer down LUE towards wrist, no N/T. Does describe some L hand weakness. Endorses chronic headaches in mornings that resolve after a bit of time/movement, non worsening over time.  Hand dominance: Right  SUBJECTIVE STATEMENT: 09/29/2022 ***  *** see assessment   PERTINENT HISTORY:  GERD, pt also endorses hx of fibromyalgia  PAIN:  Are you having pain: 10/10  Location/description: L anterior shoulder   Per eval -   Best-worst over past week: 7-10/10  - aggravating factors: standing/sitting 30-9min, lifting items, doing hair, self care - Easing factors: medication, cervical sidebending  towards R  PRECAUTIONS: None  WEIGHT BEARING RESTRICTIONS: No  FALLS:  Has patient fallen in last 6 months? Yes. Number of falls 1; beginning of Spring this year, fell in her sisters yard, had some ankle pain but denies other injury, states she followed up with her doctor afterwards   LIVING ENVIRONMENT: Lives alone in apartment, no STE. Son and his girlfriend check in on her daily Pt states family helps with heavier activities around the apartment  OCCUPATION: not working currently  PLOF: Independent - family assist for heavier activities around the home  PATIENT GOALS: get back to praise dancing, very involved in  church  NEXT MD VISIT: TBD   OBJECTIVE: (objective measures completed at initial evaluation unless otherwise dated)   DIAGNOSTIC FINDINGS:  L shoulder MRI 07/01/22, below per EPIC: "IMPRESSION: Moderate tendinosis of the distal supraspinatus and infraspinatus tendons with articular sided fraying and low-grade bursal sided tearing near the footprint, and focal calcification proximal to the footprint compatible with calcific tendinosis. No retracted cuff tear. No significant muscle atrophy.   Mild subacromial-subdeltoid bursitis.   Moderate glenohumeral osteoarthritis. Degenerative posterior and superior labral fraying.   Mild AC joint arthropathy."  Cervical XR 11/26/21 "IMPRESSION: Moderate multilevel degenerative changes of the cervical spine. No acute fracture or malalignment."  PATIENT SURVEYS:  FOTO 36 current, 57 predicted 09-02-22  41% 09/30/22: ***   COGNITION: Overall cognitive status: Within functional limits for tasks assessed  SENSATION: NT on eval 08/11/22: Light touch intact B UE aside from mild reduction C6 on L side. Negative hoffmans/tromner signs BIL UE    POSTURE: forward head, elevated B UT, rounded shoulders, holding head slightly towards R, LUE guarded  PALPATION: Deferred given time constraints  08/11/22: concordant tenderness/pain with L LS/UT, rhomboids, trigger point in infraspinatus. Reproduction of UE symptoms with trigger points in infraspinatus and levator scap  CERVICAL ROM:   Active ROM A/PROM (deg) eval AROM 09/01/22 AROM 09/30/22 ***   Flexion 100% no pain    Extension 75% painful in UE to elbow    Right lateral flexion 50% **    Left lateral flexion 75% *    Right rotation 52 deg 52 deg   Left rotation 20 deg * 40 deg *    (Blank rows = not tested) (Key: WFL = within functional limits not formally assessed, * = concordant pain, s = stiffness/stretching sensation, NT = not tested)   UPPER EXTREMITY ROM:  Active ROM Right eval Left eval Left 08/31/22 Left 09/30/22 ***   Shoulder flexion 145 deg 68 deg * 82 deg *   Shoulder abduction 92 deg 48 deg * 50 deg *   Shoulder internal rotation (functional combo)  Greater trochanter * Superior glute *   Shoulder external rotation      Elbow flexion      Elbow extension      Wrist flexion      Wrist extension       (Blank rows = not tested) (Key: WFL = within functional limits not formally assessed, * = concordant pain, s = stiffness/stretching sensation, NT = not tested)  Comments:   UPPER EXTREMITY MMT:  MMT Right eval Left eval  Shoulder flexion    Shoulder extension    Shoulder abduction    Shoulder extension    Shoulder internal rotation    Shoulder external rotation    Elbow flexion    Elbow extension    Grip strength    (Blank rows = not tested)  (Key: WFL = within functional limits not formally assessed, * = concordant pain, s = stiffness/stretching sensation, NT = not tested)  Comments: deferred given symptom irritability  CERVICAL SPECIAL TESTS:  Deferred given symptom irritability although sidebending towards L and cervical extension do provoke LUE  symptoms  FUNCTIONAL TESTS:  Overhead and IR functional reach testing significantly limited on L compared to R   TODAY'S TREATMENT:  [facility] Adult PT Treatment:  DATE: 09/30/22 Therapeutic Exercise: *** Manual Therapy: *** Neuromuscular re-ed: *** Therapeutic Activity: *** Modalities: *** Self Care: ***    [facility] Adult PT Treatment:                                                DATE: 09/16/22 Treatment deferred; arrive no charge; see assessment below   [facility] Adult PT Treatment:                                                DATE: 09/14/22 Therapeutic Exercise: Standing swiss ball flexion at table x12  Supine chin tuck x8 cues for comfortable ROM  Seated double ER + scap retraction unweighted 2x8 cues for form and comfortable ROM  Seated scap retraction x12 cues for posture  Manual Therapy: Seated; Gentle STM (open palm technique) to biceps, lateral pec, anterior delt, LS, UT, rhomboid. Most relief from STM to periscapular musculature, most irritability in bicep/deltoid   [facility] Adult PT Treatment:                                                DATE: 09/09/2022 BP R arm in sitting 116/56 O2 94%  Pulse 76   Therapeutic Exercise: Supine chin tuck 1 x 10 holding 10 second ea Seated scapular retraction 1 x 15 verbal cues to avoid shoulder hiking Seated L upper trap stretch 2 x 30 sec Seated scapular retraction with ER 2 x 10 with RTB  Updated HEP today for seated scapular retraction with ER, upper trap stretch, seated and supine chin tuck Manual Therapy: IASTM along the L upper trap Inhibition taping for the L upper trap Sub-occipital release  Trigger Point Dry-Needling  Treatment instructions: Expect mild to moderate muscle soreness. S/S of pneumothorax if dry needled over a lung field, and to seek immediate medical attention should they occur. Patient verbalized understanding of these instructions and education.  Patient Consent  Given: No Education handout provided: Previously provided Muscles treated: L upper trap Electrical stimulation performed: No Parameters:  N/A Treatment response/outcome: twitch response, pt noted relief of tension    [facility] Adult PT Treatment:                                                DATE: 09/07/22 Therapeutic Exercise: Scap retraction + double ER (unweighted) 3x5 cues for posture and comfortable ROM  Scaption/flexion with physioball on table x8 each cues for comfortable ROM Swiss ball press down at table 2x10 cues for breath control  HEP review Manual Therapy: Seated; Gentle STM (open palm technique) to biceps, lateral pec, anterior delt, LS, UT, rhomboid. Able to reproduce distal UE symptoms and gradually resolve with increased time   [facility] Adult PT Treatment:  DATE: 09-02-22 FOTO  41% Vitals 08/31/22:  HR 73bpm, SpO2 92-95% RA, 96/70 BP R arm Pt reports feeling dizzy due to vertigo and checked at MD yesterday ~42min rest HR 79-80bpm, BP 118/70 R arm SpO2 93-94% Symptoms resolved at this point Therapeutic Exercise: Standing using physioball for gentle flexion and scaption of bil  UE Side lying on R with AAROM with PT for shoulder trio to 90 degrees flexion, abd and ER Manual Therapy: Gentle STM L LS, cervical paraspinals, rhomboids, deltoid, infraspinatus, teres minor Posterior capsule stretch in R sidelying Gentle grade 2 mobs of GHJ of left shoulder and LAD Trigger Point Dry-Needling performed 12/17/21  by Garen Lah Treatment instructions: Expect mild to moderate muscle soreness. S/S of pneumothorax if dry needled over a lung field, and to seek immediate medical attention should they occur. Patient verbalized understanding of these instructions and education.  Patient Consent Given: Yes Education handout provided: Previously provided Muscles treated: Left UT/LS two points with twitch response Electrical stimulation  performed: No Parameters: N/A Treatment response/outcome: twitch response noted, pt noted relief                                                                                                                             PATIENT EDUCATION:  Education details: HEP, progress with PT thus far, rationale for interventions, PT POC ***  Person educated: Patient Education method: Explanation, Demonstration, Tactile cues, Verbal cues, and Handouts Education comprehension: verbalized understanding, returned demonstration, verbal cues required, tactile cues required, and needs further education    HOME EXERCISE PROGRAM: Access Code: [email or web address] URL: [email or web address] Date: 09/09/2022 Prepared by: Lulu Riding  Exercises - Seated Scapular Retraction  - 2-3 x daily - 7 x weekly - 1 sets - 8 reps - Seated Shoulder Flexion Towel Slide at Table Top  - 2-3 x daily - 7 x weekly - 1 sets - 8 reps - Scapular retraction with ER (MONEY)  - 1 x daily - 7 x weekly - 2 sets - 10 reps - Standing Cervical Retraction  - 1 x daily - 7 x weekly - 2 sets - 10 reps - Supine Chin Tuck with Towel  - 1 x daily - 7 x weekly - 2 sets - 10 reps - 5 hold - Seated Upper Trapezius Stretch (Mirrored)  - 2-3 x daily - 7 x weekly - 2 sets - 2 reps - 30 seconds hold  ASSESSMENT:  CLINICAL IMPRESSION: 09/29/2022 ***  *** Pt arrives and states she was able to get in with ortho earlier this morning and just received steroid injection. Given this, agrees to defer treatment on this date and reschedule. In discussion with pt, plan for progress note next session to assess continued appropriateness of PT intervention. Pt departs today's session in no acute distress, all voiced questions/concerns addressed appropriately from PT perspective.    Per eval - Pt is a very pleasant 67 year old woman who arrives to PT evaluation on  this date for neck and L shoulder pain. Pt reports difficulty with lifting items, reaching  with LUE, and self-care/household tasks due to pain. During today's session pt demonstrates limitations in cervical and GH mobility which are likely contributing to difficulty with aforementioned activities, LUE symptoms are reproduced by cervical extension and sidebending to L. Recommend skilled PT to address aforementioned deficits to improve functional independence/tolerance. No adverse events, symptoms are fairly irritable on exam, some mild increase in symptoms on departure but tolerates HEP relatively well. Pt departs today's session in no acute distress, all voiced questions/concerns addressed appropriately from PT perspective.    OBJECTIVE IMPAIRMENTS: decreased activity tolerance, decreased endurance, decreased mobility, decreased ROM, decreased strength, impaired UE functional use, improper body mechanics, postural dysfunction, and pain.   ACTIVITY LIMITATIONS: carrying, lifting, sitting, standing, sleeping, dressing, reach over head, and hygiene/grooming  PARTICIPATION LIMITATIONS: meal prep, cleaning, laundry, driving, and community activity  PERSONAL FACTORS: Age and Time since onset of injury/illness/exacerbation are also affecting patient's functional outcome.   REHAB POTENTIAL: [place]  CLINICAL DECISION MAKING: Stable/uncomplicated  EVALUATION COMPLEXITY: Low   GOALS: Goals reviewed with patient? No  SHORT TERM GOALS: Target date: 09/01/2022 Pt will demonstrate appropriate understanding and performance of initially prescribed HEP in order to facilitate improved independence with management of symptoms.  Baseline: HEP provided on eval 08/31/22: reports [facility] HEP adherence 09/30/22: ***  Goal status: ***   2. Pt will score greater than or equal to 46 on FOTO in order to demonstrate improved perception of function due to symptoms.  Baseline: 36  08/31/22: deferred given visit 5  09/02/22: 41%   09/30/22: ***   Goal status: ***   LONG TERM GOALS: Target date: 09/29/2022 Pt will  score 57 on FOTO in order to demonstrate improved perception of function due to symptoms. Baseline: 36 09/30/22: ***  Goal status: ***   2. Pt will demonstrate at least 50 degrees of active cervical rotation ROM  bilaterally in order to demonstrate improved environmental awareness and safety with driving.  Baseline: see ROM chart above 09/30/22: ***  Goal status: ***   3. Pt will report at least 50% decrease in overall pain levels in past week in order to facilitate improved tolerance to basic ADLs/mobility.   Baseline: 7-10/10  09/30/22: ***   Goal status: ***    4. Pt will report/demonstrate ability to sit/stand for up to an hour with less than 2 point increase on NPS in order to demonstrate improved tolerance to community activity. Baseline: 7-10/10 pain with typical activities 09/30/22: ***  Goal status: ***   5. Pt will endorse at least 50% improvement in frequency of waking due to pain in order to improve quality of life and overall health.  Baseline: waking 8-9x/night due to pain  09/30/22: ***   Goal status: ***   6. Pt will demonstrate at least 110deg of active L shoulder flexion ROM in order to facilitate improved functional reaching and participating in daily activities.   Baseline: see ROM chart above  09/30/22: ***   Goal status: ***    PLAN:  PT FREQUENCY: 2x/week  PT DURATION: 8 weeks  PLANNED INTERVENTIONS: Therapeutic exercises, Therapeutic activity, Neuromuscular re-education, Balance training, Gait training, Patient/Family education, Self Care, Joint mobilization, Aquatic Therapy, Dry Needling, Electrical stimulation, Spinal mobilization, Cryotherapy, Moist heat, Taping, Manual therapy, and Re-evaluation  PLAN FOR NEXT SESSION:  progress note assessment, consider extension of POC vs d/c ***    Ashley Murrain PT, DPT 09/29/2022 11:43 AM

## 2022-09-29 NOTE — Telephone Encounter (Signed)
Called pt re: this morning's missed appointment - no further appointments scheduled, left voicemail with office call back number for any questions/concerns or if she would like to schedule further appointments.

## 2022-09-30 ENCOUNTER — Encounter: Payer: Self-pay | Admitting: Physical Therapy

## 2022-09-30 ENCOUNTER — Ambulatory Visit: Payer: 59 | Attending: Family Medicine | Admitting: Physical Therapy

## 2022-09-30 DIAGNOSIS — R293 Abnormal posture: Secondary | ICD-10-CM

## 2022-09-30 DIAGNOSIS — M542 Cervicalgia: Secondary | ICD-10-CM | POA: Diagnosis not present

## 2022-09-30 DIAGNOSIS — M25512 Pain in left shoulder: Secondary | ICD-10-CM

## 2022-10-06 NOTE — Therapy (Signed)
OUTPATIENT PHYSICAL THERAPY PROGRESS NOTE    Patient Name: Mindy Stevens MRN: [id number] DOB:Dec 30, 1955, 67 y.o., female Today's Date: 10/07/2022        END OF SESSION:  PT End of Session - 10/07/22 1144     Visit Number 11    Number of Visits 14    Date for PT Re-Evaluation 10/28/22    Authorization Type [employer]    Authorization Time Period no needed  auth per appt notes    PT Start Time 1145    PT Stop Time 1230    PT Time Calculation (min) 45 min    Activity Tolerance Patient tolerated treatment well;No increased pain    Behavior During Therapy WFL for tasks assessed/performed                  Past Medical History:  Diagnosis Date   GERD (gastroesophageal reflux disease)    Hyperlipidemia    History reviewed. No pertinent surgical history. Patient Active Problem List   Diagnosis Date Noted   Other spondylosis with radiculopathy, cervical region 07/13/2022   Impingement syndrome of left shoulder 04/26/2022   Dyspnea 12/19/2020    PCP: Dot Been, FNP  REFERRING PROVIDER: Eldred Manges, MD  REFERRING DIAG: [phone] (ICD-10-CM) - Chronic left shoulder pain M47.812 (ICD-10-CM) - Cervical spondylosis  THERAPY DIAG:  Cervicalgia  Abnormal posture  Left shoulder pain, unspecified chronicity  Rationale for Evaluation and Treatment: Rehabilitation  ONSET DATE: 2-3 months for weakness, neck/shoulder pain for years   SUBJECTIVE:                                                                                                                                                                                Per eval - Pt endorses history of difficulty with lifting items, difficulty washing and doing hair. Pt endorses chronic BIL shoulder pain, R used to be worse but responded well to an injection. Developed similar pain in L shoulder which she states has not really responded to injections. Symptoms worsen with activity and prolonged positioning. Wakes  up frequently throughout the night due to pain (~8-9x/night). States symptoms refer down LUE towards wrist, no N/T. Does describe some L hand weakness. Endorses chronic headaches in mornings that resolve after a bit of time/movement, non worsening over time.  Hand dominance: Right  SUBJECTIVE STATEMENT: 10/07/2022 Pt describes pain in left shld 7/10 and can barely move L UE  Next ortho visit: December   PERTINENT HISTORY:  GERD, pt also endorses hx of fibromyalgia  PAIN:  Are you having pain: 5/10  Location/description: L anterior shoulder  Best-worst over past week: 5-10/10  Per eval -  Best-worst over past week:  7-10/10  - aggravating factors: standing/sitting 30-46min, lifting items, doing hair, self care - Easing factors: medication, cervical sidebending  towards R  PRECAUTIONS: None  WEIGHT BEARING RESTRICTIONS: No  FALLS:  Has patient fallen in last 6 months? Yes. Number of falls 1; beginning of Spring this year, fell in her sisters yard, had some ankle pain but denies other injury, states she followed up with her doctor afterwards   LIVING ENVIRONMENT: Lives alone in apartment, no STE. Son and his girlfriend check in on her daily Pt states family helps with heavier activities around the apartment  OCCUPATION: not working currently  PLOF: Independent - family assist for heavier activities around the home  PATIENT GOALS: get back to praise dancing, very involved in church  NEXT MD VISIT: TBD   OBJECTIVE: (objective measures completed at initial evaluation unless otherwise dated)   DIAGNOSTIC FINDINGS:  L shoulder MRI 07/01/22, below per EPIC: "IMPRESSION: Moderate tendinosis of the distal supraspinatus and infraspinatus tendons with articular sided fraying and low-grade bursal sided tearing near the footprint, and focal calcification proximal to the footprint compatible with calcific tendinosis. No retracted cuff tear. No significant muscle atrophy.   Mild  subacromial-subdeltoid bursitis.   Moderate glenohumeral osteoarthritis. Degenerative posterior and superior labral fraying.   Mild AC joint arthropathy."  Cervical XR 11/26/21 "IMPRESSION: Moderate multilevel degenerative changes of the cervical spine. No acute fracture or malalignment."  PATIENT SURVEYS:  FOTO 36 current, 57 predicted 09-02-22  41% 09/30/22: 45%   COGNITION: Overall cognitive status: Within functional limits for tasks assessed  SENSATION: NT on eval 08/11/22: Light touch intact B UE aside from mild reduction C6 on L side. Negative hoffmans/tromner signs BIL UE   POSTURE: forward head, elevated B UT, rounded shoulders, holding head slightly towards R, LUE guarded  PALPATION: Deferred given time constraints  08/11/22: concordant tenderness/pain with L LS/UT, rhomboids, trigger point in infraspinatus. Reproduction of UE symptoms with trigger points in infraspinatus and levator scap  CERVICAL ROM:   Active ROM A/PROM (deg) eval AROM 09/01/22 AROM 09/30/22    Flexion 100% no pain    Extension 75% painful in UE to elbow    Right lateral flexion 50% **  75% UT pain  Left lateral flexion 75% *  75% UT pain  Right rotation 52 deg 52 deg 58 deg *  Left rotation 20 deg * 40 deg * 52 deg   (Blank rows = not tested) (Key: WFL = within functional limits not formally assessed, * = concordant pain, s = stiffness/stretching sensation, NT = not tested)   UPPER EXTREMITY ROM:  Active ROM Right eval Left eval Left 08/31/22 Left 09/30/22   Left  10-07-22  Shoulder flexion 145 deg 68 deg * 82 deg * 98 deg *   Shoulder abduction 92 deg 48 deg * 50 deg * 82 deg *   Shoulder internal rotation (functional combo)  Greater trochanter * Superior glute *  After TPDN superior gluteal unable to move before TPDN today  Shoulder external rotation       Elbow flexion       Elbow extension       Wrist flexion       Wrist extension        (Blank rows = not tested) (Key: WFL = within  functional limits not formally assessed, * = concordant pain, s = stiffness/stretching sensation, NT = not tested)  Comments:   UPPER EXTREMITY MMT:  MMT Right eval Left eval  Shoulder flexion  Shoulder extension    Shoulder abduction    Shoulder extension    Shoulder internal rotation    Shoulder external rotation    Elbow flexion    Elbow extension    Grip strength    (Blank rows = not tested)  (Key: WFL = within functional limits not formally assessed, * = concordant pain, s = stiffness/stretching sensation, NT = not tested)  Comments: deferred given symptom irritability  CERVICAL SPECIAL TESTS:  Deferred given symptom irritability although sidebending towards L and cervical extension do provoke LUE symptoms  FUNCTIONAL TESTS:  Overhead and IR functional reach testing significantly limited on L compared to R   TODAY'S TREATMENT:  [facility] Adult PT Treatment:                                                DATE: 10-07-22 Therapeutic Exercise: Red band row standing 2x10 cues for posture  Swiss ball Green standing GH flexion at table x10  while Manually releasing subscapularis on Left Seated Shoulder Abduction Towel Slide at Table Top  2 x 10 Standing Shoulder Internal Rotation Stretch with Towel10 reps - 10 hold Supine Shoulder Horizontal Abduction with Resistance3 sets - 10 reps Doorway Pec Stretch at 60 Degrees Abduction with Arm Straight  - 1 x daily - 7 x weekly - 1 sets - 3 reps - 30 sec hold Manual Therapy: STW along the L upper trap and L subca Sub-occipital release  Trigger Point Dry-Needling  Treatment instructions: Expect mild to moderate muscle soreness. S/S of pneumothorax if dry needled over a lung field, and to seek immediate medical attention should they occur. Patient verbalized understanding of these instructions and education.  Patient Consent Given: No Education handout provided: Previously provided Muscles treated: L upper trap L subscapularis L  LS Electrical stimulation performed: No Parameters:  N/A Treatment response/outcome: twitch response, pt noted relief of tension but pain remains  [facility] Adult PT Treatment:                                                DATE: 09/30/22 Therapeutic Exercise: Double ER + scapular retraction x10 unweighted, red band x10 Red band row standing 2x10 cues for posture  Swiss ball standing GH flexion at table x10  HEP update + education  Therapeutic Activity: MSK assessment + education FOTO + education Significant time spent with education/discussion re: progress with PT, symptom behavior as it affects activity tolerance, PT goals/POC    [facility] Adult PT Treatment:                                                DATE: 09/16/22 Treatment deferred; arrive no charge; see assessment below   [facility] Adult PT Treatment:                                                DATE: 09/14/22 Therapeutic Exercise: Standing swiss ball flexion at table x12  Supine chin tuck x8  cues for comfortable ROM  Seated double ER + scap retraction unweighted 2x8 cues for form and comfortable ROM  Seated scap retraction x12 cues for posture  Manual Therapy: Seated; Gentle STM (open palm technique) to biceps, lateral pec, anterior delt, LS, UT, rhomboid. Most relief from STM to periscapular musculature, most irritability in bicep/deltoid   [facility] Adult PT Treatment:                                                DATE: 09/09/2022 BP R arm in sitting 116/56 O2 94%  Pulse 76   Therapeutic Exercise: Supine chin tuck 1 x 10 holding 10 second ea Seated scapular retraction 1 x 15 verbal cues to avoid shoulder hiking Seated L upper trap stretch 2 x 30 sec Seated scapular retraction with ER 2 x 10 with RTB  Updated HEP today for seated scapular retraction with ER, upper trap stretch, seated and supine chin tuck Manual Therapy: IASTM along the L upper trap Inhibition taping for the L upper trap Sub-occipital release  Trigger Point  Dry-Needling  Treatment instructions: Expect mild to moderate muscle soreness. S/S of pneumothorax if dry needled over a lung field, and to seek immediate medical attention should they occur. Patient verbalized understanding of these instructions and education.  Patient Consent Given: No Education handout provided: Previously provided Muscles treated: L upper trap Electrical stimulation performed: No Parameters:  N/A Treatment response/outcome: twitch response, pt noted relief of tension    [facility] Adult PT Treatment:                                                DATE: 09/07/22 Therapeutic Exercise: Scap retraction + double ER (unweighted) 3x5 cues for posture and comfortable ROM  Scaption/flexion with physioball on table x8 each cues for comfortable ROM Swiss ball press down at table 2x10 cues for breath control  HEP review Manual Therapy: Seated; Gentle STM (open palm technique) to biceps, lateral pec, anterior delt, LS, UT, rhomboid. Able to reproduce distal UE symptoms and gradually resolve with increased time   [facility] Adult PT Treatment:                                                DATE: 09-02-22 FOTO  41% Vitals 08/31/22:  HR 73bpm, SpO2 92-95% RA, 96/70 BP R arm Pt reports feeling dizzy due to vertigo and checked at MD yesterday ~26min rest HR 79-80bpm, BP 118/70 R arm SpO2 93-94% Symptoms resolved at this point Therapeutic Exercise: Standing using physioball for gentle flexion and scaption of bil  UE Side lying on R with AAROM with PT for shoulder trio to 90 degrees flexion, abd and ER Manual Therapy: Gentle STM L LS, cervical paraspinals, rhomboids, deltoid, infraspinatus, teres minor Posterior capsule stretch in R sidelying Gentle grade 2 mobs of GHJ of left shoulder and LAD Trigger Point Dry-Needling performed 12/17/21  by Garen Lah Treatment instructions: Expect mild to moderate muscle soreness. S/S of pneumothorax if dry needled over a lung field, and to seek  immediate medical attention should they occur. Patient verbalized understanding of  these instructions and education.  Patient Consent Given: Yes Education handout provided: Previously provided Muscles treated: Left UT/LS two points with twitch response Electrical stimulation performed: No Parameters: N/A Treatment response/outcome: twitch response noted, pt noted relief                                                                                                                             PATIENT EDUCATION:  Education details: HEP, progress with PT thus far, rationale for interventions, PT POC   Person educated: Patient Education method: Explanation, Demonstration, Tactile cues, Verbal cues, and Handouts Education comprehension: verbalized understanding, returned demonstration, verbal cues required, tactile cues required, and needs further education    HOME EXERCISE PROGRAM: Access Code: [email or web address] URL: [email or web address] Date: 09/30/2022 Prepared by: Fransisco Hertz  Exercises - Seated Shoulder Flexion Towel Slide at Table Top  - 2-3 x daily - 7 x weekly - 1 sets - 8 reps - Scapular retraction with ER (MONEY)  - 1 x daily - 7 x weekly - 2 sets - 10 reps - Standing Shoulder Row with Anchored Resistance  - 2-3 x daily - 7 x weekly - 1 sets - 10 reps - Standing Cervical Retraction  - 1 x daily - 7 x weekly - 2 sets - 10 reps - Seated Upper Trapezius Stretch (Mirrored)  - 2-3 x daily - 7 x weekly - 2 sets - 2 reps - 30 seconds hold Added 10-07-22 Seated Shoulder Abduction Towel Slide at Table Top  - 2-3 x daily - 7 x weekly - 3 sets - 10 reps Standing Shoulder Internal Rotation Stretch with Towel  - 1 x daily - 7 x weekly - 1 sets - 10 reps - 10 hold Supine Shoulder Horizontal Abduction with Resistance  - 1 x daily - 7 x weekly - 3 sets - 10 reps Doorway Pec Stretch at 60 Degrees Abduction with Arm Straight  - 1 x daily - 7 x weekly - 1 sets - 3 reps - 30 sec  hold ASSESSMENT:  CLINICAL IMPRESSION: 10/07/2022 Pt arrives w/ 7/10 pain - reports decreasing relief after injection but symptoms have since returned and remain quite severe at times especially today.  Pt consents to TPDN and is very sensitive but did tolerate  TPDN with improved mobiilty and able to IR Left UE to superior gluteal but pain remains.  Pt performed exercises and added to HEP some supine and other exercises to be better tolerated.  No adverse events, denies any significant change in symptoms on departure. Pt departs today's session in no acute distress, all voiced questions/concerns addressed appropriately from PT perspective.    Per eval - Pt is a very pleasant 67 year old woman who arrives to PT evaluation on this date for neck and L shoulder pain. Pt reports difficulty with lifting items, reaching with LUE, and self-care/household tasks due to pain. During today's session pt demonstrates limitations in cervical and GH mobility which are likely  contributing to difficulty with aforementioned activities, LUE symptoms are reproduced by cervical extension and sidebending to L. Recommend skilled PT to address aforementioned deficits to improve functional independence/tolerance. No adverse events, symptoms are fairly irritable on exam, some mild increase in symptoms on departure but tolerates HEP relatively well. Pt departs today's session in no acute distress, all voiced questions/concerns addressed appropriately from PT perspective.    OBJECTIVE IMPAIRMENTS: decreased activity tolerance, decreased endurance, decreased mobility, decreased ROM, decreased strength, impaired UE functional use, improper body mechanics, postural dysfunction, and pain.   ACTIVITY LIMITATIONS: carrying, lifting, sitting, standing, sleeping, dressing, reach over head, and hygiene/grooming  PARTICIPATION LIMITATIONS: meal prep, cleaning, laundry, driving, and community activity  PERSONAL FACTORS: Age and Time since  onset of injury/illness/exacerbation are also affecting patient's functional outcome.   REHAB POTENTIAL: [place]  CLINICAL DECISION MAKING: Stable/uncomplicated  EVALUATION COMPLEXITY: Low   GOALS: Goals reviewed with patient? No  SHORT TERM GOALS: Target date: 09/01/2022 Pt will demonstrate appropriate understanding and performance of initially prescribed HEP in order to facilitate improved independence with management of symptoms.  Baseline: HEP provided on eval 08/31/22: reports [facility] HEP adherence 09/30/22: daily adherence, improved tolerance Goal status: MET   2. Pt will score greater than or equal to 46 on FOTO in order to demonstrate improved perception of function due to symptoms.  Baseline: 36  08/31/22: deferred given visit 5  09/02/22: 41%   09/30/22: 45   Goal status: NEARLY MET   LONG TERM GOALS: Target date: 10/28/2022 (updated 09/30/22) Pt will score 57 on FOTO in order to demonstrate improved perception of function due to symptoms. Baseline: 36 09/30/22: 45  Goal status: PROGRESSING   2. Pt will demonstrate at least 50 degrees of active cervical rotation ROM  bilaterally in order to demonstrate improved environmental awareness and safety with driving.  Baseline: see ROM chart above 09/30/22: >50 deg BIL although remains painful  Goal status: PARTIALLY MET   3. Pt will report at least 50% decrease in overall pain levels in past week in order to facilitate improved tolerance to basic ADLs/mobility.   Baseline: 7-10/10  09/30/22: 5-10/10   Goal status: ONGOING     4. Pt will report/demonstrate ability to sit/stand for up to an hour with less than 2 point increase on NPS in order to demonstrate improved tolerance to community activity. Baseline: 7-10/10 pain with typical activities 09/30/22: positional tolerance before increase in pain  Goal status: NOT MET  5. Pt will endorse at least 50% improvement in frequency of waking due to pain in order to improve quality of  life and overall health.  Baseline: waking 8-9x/night due to pain  09/30/22: 3x/night on average   Goal status: MET   6. Pt will demonstrate at least 110deg of active L shoulder flexion ROM in order to facilitate improved functional reaching and participating in daily activities.   Baseline: see ROM chart above  09/30/22: 98 deg GH flexion AROM LUE    Goal status: PROGRESSING    PLAN: UPDATED 09/30/22  PT FREQUENCY: 1x/week  PT DURATION: 4 weeks  PLANNED INTERVENTIONS: Therapeutic exercises, Therapeutic activity, Neuromuscular re-education, Balance training, Gait training, Patient/Family education, Self Care, Joint mobilization, Aquatic Therapy, Dry Needling, Electrical stimulation, Spinal mobilization, Cryotherapy, Moist heat, Taping, Manual therapy, and Re-evaluation  PLAN FOR NEXT SESSION:  continue to progress GH/cervical mobility, periscapular/cervical stability. Pt is interested in trying dry needling again.    Garen Lah, PT, [facility] Certified Exercise Expert for the Aging Adult  10/07/22 12:37 PM Phone: [phone] Fax: [phone]

## 2022-10-07 ENCOUNTER — Encounter: Payer: Self-pay | Admitting: Physical Therapy

## 2022-10-07 ENCOUNTER — Ambulatory Visit: Payer: 59 | Admitting: Physical Therapy

## 2022-10-07 DIAGNOSIS — M542 Cervicalgia: Secondary | ICD-10-CM

## 2022-10-07 DIAGNOSIS — R293 Abnormal posture: Secondary | ICD-10-CM

## 2022-10-07 DIAGNOSIS — M25512 Pain in left shoulder: Secondary | ICD-10-CM

## 2022-10-11 ENCOUNTER — Other Ambulatory Visit: Payer: Self-pay | Admitting: Family Medicine

## 2022-10-11 DIAGNOSIS — Z1231 Encounter for screening mammogram for malignant neoplasm of breast: Secondary | ICD-10-CM

## 2022-10-12 ENCOUNTER — Ambulatory Visit: Payer: 59 | Admitting: Physical Therapy

## 2022-10-12 ENCOUNTER — Telehealth: Payer: Self-pay | Admitting: Physical Therapy

## 2022-10-12 NOTE — Telephone Encounter (Signed)
10-12-22  Left VM for Ms Galvan,  alerting her she did not show for today's PT appt.  Ms Landress was reminded of her 10-21-22 appt at 10:15 AM.  Pt was also reminded of attendance policy.  Pt also instructed to call front office if she is no longer needing PT.    Garen Lah, PT, [facility] Certified Exercise Expert for the Aging Adult  10/12/22 4:17 PM Phone: [phone] Fax: [phone]

## 2022-10-14 ENCOUNTER — Ambulatory Visit: Payer: 59 | Admitting: Physical Therapy

## 2022-10-15 ENCOUNTER — Ambulatory Visit
Admission: RE | Admit: 2022-10-15 | Discharge: 2022-10-15 | Disposition: A | Discharge status: Home / Self Care | Payer: 59 | Source: Ambulatory Visit | Attending: Family Medicine | Admitting: Family Medicine

## 2022-10-15 DIAGNOSIS — Z1231 Encounter for screening mammogram for malignant neoplasm of breast: Secondary | ICD-10-CM

## 2022-10-20 NOTE — Therapy (Signed)
OUTPATIENT PHYSICAL THERAPY NOTE + DISCHARGE SUMMARY   Patient Name: MRIDULA PIZZI MRN: [id number] DOB:04-05-1955, 67 y.o., female Today's Date: 10/21/2022   PHYSICAL THERAPY DISCHARGE SUMMARY  Visits from Start of Care: 12  Current functional level related to goals / functional outcomes: Able to perform majority of daily activities with increased pain   Remaining deficits: Pain, limitations in cervical/GH ROM   Education / Equipment: HEP, progress with PT, discharge education, follow up with provider   Patient agrees to discharge. Patient goals were partially met. Patient is being discharged due to plateau in progress, meeting majority of rehab goals.    END OF SESSION:  PT End of Session - 10/21/22 1021     Visit Number 12    Number of Visits 14    Date for PT Re-Evaluation 10/28/22    Authorization Type [employer]    Authorization Time Period no needed  auth per appt notes    Progress Note Due on Visit 20    PT Start Time 1022   late check in   PT Stop Time 1057    PT Time Calculation (min) 35 min    Activity Tolerance Patient tolerated treatment well;No increased pain    Behavior During Therapy WFL for tasks assessed/performed              Past Medical History:  Diagnosis Date   GERD (gastroesophageal reflux disease)    Hyperlipidemia    History reviewed. No pertinent surgical history. Patient Active Problem List   Diagnosis Date Noted   Other spondylosis with radiculopathy, cervical region 07/13/2022   Impingement syndrome of left shoulder 04/26/2022   Dyspnea 12/19/2020    PCP: Dot Been, FNP  REFERRING PROVIDER: Eldred Manges, MD  REFERRING DIAG: [phone] (ICD-10-CM) - Chronic left shoulder pain M47.812 (ICD-10-CM) - Cervical spondylosis  THERAPY DIAG:  Cervicalgia  Abnormal posture  Left shoulder pain, unspecified chronicity  Rationale for Evaluation and Treatment: Rehabilitation  ONSET DATE: 2-3 months for weakness,  neck/shoulder pain for years   SUBJECTIVE:                                                                                                                                                                                Per eval - Pt endorses history of difficulty with lifting items, difficulty washing and doing hair. Pt endorses chronic BIL shoulder pain, R used to be worse but responded well to an injection. Developed similar pain in L shoulder which she states has not really responded to injections. Symptoms worsen with activity and prolonged positioning. Wakes up frequently throughout the night due to pain (~8-9x/night). States symptoms refer down LUE towards wrist, no N/T.  Does describe some L hand weakness. Endorses chronic headaches in mornings that resolve after a bit of time/movement, non worsening over time.  Hand dominance: Right  SUBJECTIVE STATEMENT: 10/21/2022 Pt states she is having about 5/10 pain in L neck/shoulder, still bothering her but better than it was. Did some folding clothes at an event this weekend without bothering her too much, which she states she wouldn't have been able to do before. HEP going well at home. States she would like to discharge today, symptoms more manageable now but seems to have hit a plateau.   Next ortho visit: December   PERTINENT HISTORY:  GERD, pt also endorses hx of fibromyalgia  PAIN:  Are you having pain: 5/10   Location/description: L anterior shoulder  Best-worst over past week: 5-7/10  Per eval -  Best-worst over past week: 7-10/10  - aggravating factors: standing/sitting 30-75min, lifting items, doing hair, self care - Easing factors: medication, cervical sidebending  towards R  PRECAUTIONS: None  WEIGHT BEARING RESTRICTIONS: No  FALLS:  Has patient fallen in last 6 months? Yes. Number of falls 1; beginning of Spring this year, fell in her sisters yard, had some ankle pain but denies other injury, states she followed up with her  doctor afterwards   LIVING ENVIRONMENT: Lives alone in apartment, no STE. Son and his girlfriend check in on her daily Pt states family helps with heavier activities around the apartment  OCCUPATION: not working currently  PLOF: Independent - family assist for heavier activities around the home  PATIENT GOALS: get back to praise dancing, very involved in church  NEXT MD VISIT: December  OBJECTIVE: (objective measures completed at initial evaluation unless otherwise dated)   DIAGNOSTIC FINDINGS:  L shoulder MRI 07/01/22, below per EPIC: "IMPRESSION: Moderate tendinosis of the distal supraspinatus and infraspinatus tendons with articular sided fraying and low-grade bursal sided tearing near the footprint, and focal calcification proximal to the footprint compatible with calcific tendinosis. No retracted cuff tear. No significant muscle atrophy.   Mild subacromial-subdeltoid bursitis.   Moderate glenohumeral osteoarthritis. Degenerative posterior and superior labral fraying.   Mild AC joint arthropathy."  Cervical XR 11/26/21 "IMPRESSION: Moderate multilevel degenerative changes of the cervical spine. No acute fracture or malalignment."  PATIENT SURVEYS:  FOTO 36 current, 57 predicted 09-02-22  41% 09/30/22: 45%  10/21/22: 46%   COGNITION: Overall cognitive status: Within functional limits for tasks assessed  SENSATION: NT on eval 08/11/22: Light touch intact B UE aside from mild reduction C6 on L side. Negative hoffmans/tromner signs BIL UE   POSTURE: forward head, elevated B UT, rounded shoulders, holding head slightly towards R, LUE guarded  PALPATION: Deferred given time constraints  08/11/22: concordant tenderness/pain with L LS/UT, rhomboids, trigger point in infraspinatus. Reproduction of UE symptoms with trigger points in infraspinatus and levator scap  CERVICAL ROM:   Active ROM A/PROM (deg) eval AROM 09/01/22 AROM 09/30/22   AROM 10/21/22  Flexion 100% no pain      Extension 75% painful in UE to elbow     Right lateral flexion 50% **  75% UT pain 100% mild UT pain  Left lateral flexion 75% *  75% UT pain 100% mild UT pain  Right rotation 52 deg 52 deg 58 deg * 58 deg  Left rotation 20 deg * 40 deg * 52 deg 49 deg *   (Blank rows = not tested) (Key: WFL = within functional limits not formally assessed, * = concordant pain, s = stiffness/stretching sensation, NT =  not tested)   UPPER EXTREMITY ROM:  Active ROM Right eval Left eval Left 08/31/22 Left 09/30/22   Left  10-07-22 Left 10/21/22  Shoulder flexion 145 deg 68 deg * 82 deg * 98 deg *  112 deg *  Shoulder abduction 92 deg 48 deg * 50 deg * 82 deg *  94 deg *  Shoulder internal rotation (functional combo)  Greater trochanter * Superior glute *  After TPDN superior gluteal unable to move before TPDN today L5 *   Shoulder external rotation        Elbow flexion        Elbow extension        Wrist flexion        Wrist extension         (Blank rows = not tested) (Key: WFL = within functional limits not formally assessed, * = concordant pain, s = stiffness/stretching sensation, NT = not tested)  Comments:   UPPER EXTREMITY MMT:  MMT Right eval Left eval  Shoulder flexion    Shoulder extension    Shoulder abduction    Shoulder extension    Shoulder internal rotation    Shoulder external rotation    Elbow flexion    Elbow extension    Grip strength    (Blank rows = not tested)  (Key: WFL = within functional limits not formally assessed, * = concordant pain, s = stiffness/stretching sensation, NT = not tested)  Comments: deferred given symptom irritability  CERVICAL SPECIAL TESTS:  Deferred given symptom irritability although sidebending towards L and cervical extension do provoke LUE symptoms  FUNCTIONAL TESTS:  Overhead and IR functional reach testing significantly limited on L compared to R 10/21/22: overhead reach remains limited on LUE   TODAY'S TREATMENT:  [facility] Adult PT  Treatment:                                                DATE: 10/21/22 Therapeutic Exercise: Shoulder flexion/abduction slides at counter x10 each Double ER + scapular retraction red band x10 Seated chin tucks x5 cues for comfortable ROM and form, appropriate ROM, monitoring symptoms and modifying accordingly as this is somewhat irritable today Functional IR stretch w/ strap x10 Horizontal abduction seated x10 red band  HEP handout + education on appropriate performance of exercises  Therapeutic Activity: MSK assessment + education FOTO + education Education/discussion re: progress with PT, symptom behavior as it affects activity tolerance, PT goals/POC    PATIENT EDUCATION:  Education details: HEP, progress with PT thus far, rationale for interventions, PT POC, discharge education, follow up with provider Person educated: Patient Education method: Explanation, Demonstration, Tactile cues, Verbal cues, and Handouts Education comprehension: verbalized understanding, returned demonstration, verbal cues required, tactile cues required, and needs further education    HOME EXERCISE PROGRAM: Access Code: [email or web address] URL: [email or web address] Date: 10/21/2022 Prepared by: Fransisco Hertz  Exercises - Seated Shoulder Flexion Towel Slide at Table Top  - 2-3 x daily - 7 x weekly - 1 sets - 8 reps - Seated Shoulder Abduction Towel Slide at Table Top  - 2-3 x daily - 7 x weekly - 3 sets - 10 reps - Scapular retraction with ER (MONEY)  - 1 x daily - 7 x weekly - 2 sets - 10 reps - Standing Shoulder Row with Anchored Resistance  - 2-3 x daily -  7 x weekly - 1 sets - 10 reps - Standing Cervical Retraction  - 1 x daily - 7 x weekly - 2 sets - 10 reps - Seated Upper Trapezius Stretch (Mirrored)  - 2-3 x daily - 7 x weekly - 2 sets - 2 reps - 30 seconds hold - Standing Shoulder Internal Rotation Stretch with Towel  - 1 x daily - 7 x weekly - 1 sets - 10 reps - 10 hold - Supine Shoulder  Horizontal Abduction with Resistance  - 1 x daily - 7 x weekly - 3 sets - 10 reps  ASSESSMENT:  CLINICAL IMPRESSION: 10/21/2022 Pt arrives w/ 5/10 pain - endorses slowly improving mobility but no significant change in symptoms over past few weeks, states she feels she has hit a plateau and would like to proceed with discharging today. Looking at goals she has made [place] progress in regards to mobility and functional tolerance but continues to have about the same amount of pain after initial improvement w/ injection. Pt does well with HEP - mild irritability intermittently but no increase in resting pain, education is provided on appropriate performance, monitoring symptoms and modifying accordingly. Discharge education is provided - encouraged pt to continue with HEP, monitor symptoms closely and reach out to provider with any changes. Pt denies any questions/concerns, states she feels confident discharging today. No adverse events, mutual decision made to discharge with independent HEP and provider follow up.   Per eval - Pt is a very pleasant 68 year old woman who arrives to PT evaluation on this date for neck and L shoulder pain. Pt reports difficulty with lifting items, reaching with LUE, and self-care/household tasks due to pain. During today's session pt demonstrates limitations in cervical and GH mobility which are likely contributing to difficulty with aforementioned activities, LUE symptoms are reproduced by cervical extension and sidebending to L. Recommend skilled PT to address aforementioned deficits to improve functional independence/tolerance. No adverse events, symptoms are fairly irritable on exam, some mild increase in symptoms on departure but tolerates HEP relatively well. Pt departs today's session in no acute distress, all voiced questions/concerns addressed appropriately from PT perspective.    OBJECTIVE IMPAIRMENTS: decreased activity tolerance, decreased endurance, decreased mobility,  decreased ROM, decreased strength, impaired UE functional use, improper body mechanics, postural dysfunction, and pain.   ACTIVITY LIMITATIONS: carrying, lifting, sitting, standing, sleeping, dressing, reach over head, and hygiene/grooming  PARTICIPATION LIMITATIONS: meal prep, cleaning, laundry, driving, and community activity  PERSONAL FACTORS: Age and Time since onset of injury/illness/exacerbation are also affecting patient's functional outcome.   REHAB POTENTIAL: [place]  CLINICAL DECISION MAKING: Stable/uncomplicated  EVALUATION COMPLEXITY: Low   GOALS: Goals reviewed with patient? No  SHORT TERM GOALS: Target date: 09/01/2022 Pt will demonstrate appropriate understanding and performance of initially prescribed HEP in order to facilitate improved independence with management of symptoms.  Baseline: HEP provided on eval 08/31/22: reports [place] HEP adherence 09/30/22: daily adherence, improved tolerance Goal status: MET   2. Pt will score greater than or equal to 46 on FOTO in order to demonstrate improved perception of function due to symptoms.  Baseline: 36  08/31/22: deferred given visit 5  09/02/22: 41%   09/30/22: 45   10/21/22: 46  Goal status: MET   LONG TERM GOALS: Target date: 10/28/2022 (updated 09/30/22) Pt will score 57 on FOTO in order to demonstrate improved perception of function due to symptoms. Baseline: 36 09/30/22: 45  10/21/22: 46 Goal status: NOT MET  2. Pt will demonstrate  at least 50 degrees of active cervical rotation ROM  bilaterally in order to demonstrate improved environmental awareness and safety with driving.  Baseline: see ROM chart above 09/30/22: >50 deg BIL although remains painful  10/21/22: see ROM chart above Goal status: PARTIALLY MET   3. Pt will report at least 50% decrease in overall pain levels in past week in order to facilitate improved tolerance to basic ADLs/mobility.   Baseline: 7-10/10  09/30/22: 5-10/10   10/21/22: 5-7/10  Goal status:  NOT MET  4. Pt will report/demonstrate ability to sit/stand for up to an hour with less than 2 point increase on NPS in order to demonstrate improved tolerance to community activity. Baseline: 7-10/10 pain with typical activities 09/30/22: positional tolerance before increase in pain  10/21/22: no limitations in sitting, 2 hrs for standing Goal status: MET  5. Pt will endorse at least 50% improvement in frequency of waking due to pain in order to improve quality of life and overall health.  Baseline: waking 8-9x/night due to pain  09/30/22: 3x/night on average   10/21/22: 2x/night on avg   Goal status: MET   6. Pt will demonstrate at least 110deg of active L shoulder flexion ROM in order to facilitate improved functional reaching and participating in daily activities.   Baseline: see ROM chart above  09/30/22: 98 deg GH flexion AROM LUE    10/21/22: 112 deg with pain LUE   Goal status: MET   PLAN: DISCHARGE 10/21/22  PT FREQUENCY: NA  PT DURATION: NA  PLANNED INTERVENTIONS: NA  PLAN FOR NEXT SESSION: discharge to independent HEP and provider follow up   Ashley Murrain PT, DPT 10/21/2022 12:57 PM

## 2022-10-21 ENCOUNTER — Encounter: Payer: Self-pay | Admitting: Physical Therapy

## 2022-10-21 ENCOUNTER — Ambulatory Visit: Payer: 59 | Attending: Family Medicine | Admitting: Physical Therapy

## 2022-10-21 DIAGNOSIS — R293 Abnormal posture: Secondary | ICD-10-CM | POA: Diagnosis present

## 2022-10-21 DIAGNOSIS — M542 Cervicalgia: Secondary | ICD-10-CM | POA: Diagnosis present

## 2022-10-21 DIAGNOSIS — M25512 Pain in left shoulder: Secondary | ICD-10-CM | POA: Insufficient documentation

## 2022-10-28 ENCOUNTER — Ambulatory Visit: Payer: 59 | Admitting: Physical Therapy

## 2023-01-21 ENCOUNTER — Ambulatory Visit: Payer: 59 | Admitting: Orthopedic Surgery

## 2023-10-12 ENCOUNTER — Ambulatory Visit: Admitting: Orthopedic Surgery

## 2023-11-01 LAB — COLOGUARD: COLOGUARD: NEGATIVE

## 2023-11-02 ENCOUNTER — Ambulatory Visit (INDEPENDENT_AMBULATORY_CARE_PROVIDER_SITE_OTHER): Admitting: Orthopedic Surgery

## 2023-11-02 ENCOUNTER — Other Ambulatory Visit: Payer: Self-pay

## 2023-11-02 ENCOUNTER — Encounter: Payer: Self-pay | Admitting: Orthopedic Surgery

## 2023-11-02 DIAGNOSIS — M25512 Pain in left shoulder: Secondary | ICD-10-CM

## 2023-11-02 DIAGNOSIS — M19012 Primary osteoarthritis, left shoulder: Secondary | ICD-10-CM

## 2023-11-02 DIAGNOSIS — G8929 Other chronic pain: Secondary | ICD-10-CM | POA: Diagnosis not present

## 2023-11-02 MED ORDER — BUPIVACAINE HCL 0.5 % IJ SOLN
9.0000 mL | INTRAMUSCULAR | Status: AC | PRN
  Administered 2023-11-02: 9 mL via INTRA_ARTICULAR

## 2023-11-02 MED ORDER — TRIAMCINOLONE ACETONIDE 40 MG/ML IJ SUSP
40.0000 mg | INTRAMUSCULAR | Status: AC | PRN
  Administered 2023-11-02: 40 mg via INTRA_ARTICULAR

## 2023-11-02 MED ORDER — LIDOCAINE HCL 1 % IJ SOLN
5.0000 mL | INTRAMUSCULAR | Status: AC | PRN
  Administered 2023-11-02: 5 mL

## 2023-11-02 NOTE — Progress Notes (Signed)
 Office Visit Note   Patient: Mindy Stevens           Date of Birth: 1955/09/06           MRN: [id number] Visit Date: 11/02/2023 Requested by: Earvin Johnston JINNY, FNP [address] [place],  [place] [address] PCP: Earvin Johnston JINNY, FNP  Subjective: Chief Complaint  Patient presents with   Left Shoulder - Pain    Wants injection Previous GH injection 2024    HPI: Mindy Stevens is a 68 y.o. female who presents to the office reporting left shoulder pain.  Has known history of left shoulder moderate glenohumeral arthritis based on MRI scan from 2024.  Had Ruddell relief from glenohumeral joint injection last year..                ROS: All systems reviewed are negative as they relate to the chief complaint within the history of present illness.  Patient denies fevers or chills.  Assessment & Plan: Visit Diagnoses:  1. Chronic left shoulder pain   2. Arthritis of left shoulder region     Plan: Impression is left shoulder arthritis with well-maintained motion and strength.  Glenohumeral joint injection performed today.  Will see how she tolerates that.  Follow-up with us  as needed.  Follow-Up Instructions: No follow-ups on file.   Orders:  Orders Placed This Encounter  Procedures   US  Guided Needle Placement - No Linked Charges   No orders of the defined types were placed in this encounter.     Procedures: Large Joint Inj: L glenohumeral on 11/02/2023 2:57 PM Indications: diagnostic evaluation and pain Details: 22 G 3.5 in needle, ultrasound-guided posterior approach  Arthrogram: No  Medications: 9 mL bupivacaine  0.5 %; 5 mL lidocaine  1 %; 40 mg triamcinolone  acetonide 40 MG/ML Outcome: tolerated well, no immediate complications Procedure, treatment alternatives, risks and benefits explained, specific risks discussed. Consent was given by the patient. Immediately prior to procedure a time out was called to verify the correct patient, procedure, equipment, support staff and  site/side marked as required. Patient was prepped and draped in the usual sterile fashion.       Clinical Data: No additional findings.  Objective: Vital Signs: There were no vitals taken for this visit.  Physical Exam:  Constitutional: Patient appears well-developed HEENT:  Head: Normocephalic Eyes:EOM are normal Neck: Normal range of motion Cardiovascular: Normal rate Pulmonary/chest: Effort normal Neurologic: Patient is alert Skin: Skin is warm Psychiatric: Patient has normal mood and affect  Ortho Exam: Ortho exam demonstrates on the left-hand side [place] rotator cuff strength infraspinatus supraspinatus and subscap muscle testing.  Range of motion is 60/90/160.  No discrete AC joint tenderness is present.  Not too much coarseness or grinding with internal/external rotation of the shoulder at 90 degrees of abduction.  Specialty Comments:  No specialty comments available.  Imaging: No results found.   PMFS History: Patient Active Problem List   Diagnosis Date Noted   Other spondylosis with radiculopathy, cervical region 07/13/2022   Impingement syndrome of left shoulder 04/26/2022   Dyspnea 12/19/2020   Past Medical History:  Diagnosis Date   GERD (gastroesophageal reflux disease)    Hyperlipidemia     Family History  Problem Relation Age of Onset   Breast cancer Paternal Aunt     No past surgical history on file. Social History   Occupational History   Not on file  Tobacco Use   Smoking status: Former   Smokeless tobacco: Never  Vaping Use   Vaping status: Never Used  Substance and Sexual Activity   Alcohol use: No   Drug use: No   Sexual activity: Never

## 2023-12-19 ENCOUNTER — Encounter: Payer: Self-pay | Admitting: Radiology

## 2023-12-23 ENCOUNTER — Other Ambulatory Visit: Payer: Self-pay

## 2023-12-23 ENCOUNTER — Encounter: Payer: Self-pay | Admitting: Emergency Medicine

## 2023-12-23 ENCOUNTER — Ambulatory Visit
Admission: EM | Admit: 2023-12-23 | Discharge: 2023-12-23 | Disposition: A | Discharge status: Home / Self Care | Attending: Nurse Practitioner | Admitting: Nurse Practitioner

## 2023-12-23 DIAGNOSIS — M25552 Pain in left hip: Secondary | ICD-10-CM | POA: Diagnosis not present

## 2023-12-23 DIAGNOSIS — M25551 Pain in right hip: Secondary | ICD-10-CM | POA: Diagnosis not present

## 2023-12-23 DIAGNOSIS — M545 Low back pain, unspecified: Secondary | ICD-10-CM | POA: Diagnosis not present

## 2023-12-23 MED ORDER — DEXAMETHASONE SOD PHOSPHATE PF 10 MG/ML IJ SOLN
10.0000 mg | Freq: Once | INTRAMUSCULAR | Status: AC
  Administered 2023-12-23: 10 mg via INTRAMUSCULAR

## 2023-12-23 MED ORDER — TIZANIDINE HCL 4 MG PO TABS: 4.0000 mg | ORAL_TABLET | Freq: Three times a day (TID) | ORAL | 0 refills | Status: AC | PRN

## 2023-12-23 MED ORDER — PREDNISONE 20 MG PO TABS: 40.0000 mg | ORAL_TABLET | Freq: Every day | ORAL | 0 refills | Status: AC

## 2023-12-23 MED ORDER — KETOROLAC TROMETHAMINE 30 MG/ML IJ SOLN
30.0000 mg | Freq: Once | INTRAMUSCULAR | Status: AC
  Administered 2023-12-23: 30 mg via INTRAMUSCULAR

## 2023-12-23 NOTE — Discharge Instructions (Addendum)
 You were given injections of Decadron 10 mg and Toradol 30 mg.  Start the prednisone tomorrow.  You may take Tylenol for breakthrough pain or discomfort. Take medication as prescribed.  While taking the prednisone, you need to take Tylenol as needed for breakthrough pain or discomfort. Recommend the use of ice or heat.  Apply ice for pain or swelling, heat for spasm or stiffness.  Apply for 20 minutes, remove for 1 hour, repeat as needed. Gentle stretching and range of motion exercises while symptoms persist. Try to remain active to help decrease your recovery time. Go to the emergency department if you experience worsening low back pain or bilateral hip pain, loss of bowel or bladder function, you become unable to walk, or other concerns. If your symptoms fail to improve with this treatment, please follow-up with your primary care physician for further evaluation. Follow-up as needed.

## 2023-12-23 NOTE — ED Triage Notes (Signed)
 Pt reports was helping family member get into the car and reports had to hold them up for a few minutes. Pt reports lower back pain since Wednesday that radiates to bilateral hips ever since. Has been taking tylenol and ibuprofen with no change in pain. Denies GI/GU symptoms.

## 2023-12-23 NOTE — ED Provider Notes (Signed)
 [facility]    CSN: [id number] Arrival date & time: 12/23/23  1702      History   Chief Complaint Chief Complaint  Patient presents with   Back Pain    HPI Mindy Stevens is a 68 y.o. female.   The history is provided by the patient.   Patient presents for a several day history of bilateral low back pain and pain in the bilateral hips and groin area.  Patient states she was trying to keep a family member from falling, states that she was holding the family member up by their clothing for approximately 5 minutes.  She states since that time, she has had pain in her low back, states that the pain radiates to her bilateral hips and groin.  She states the pain worsens with movement and when going from a sitting to a standing position.  She denies lower extremity numbness, tingling, weakness, loss of bowel or bladder function, saddle anesthesia, or urinary symptoms.  Patient states she has been taking over-the-counter Tylenol and ibuprofen for her pain with minimal relief of her symptoms.  Patient denies history of low back pain.  Past Medical History:  Diagnosis Date   GERD (gastroesophageal reflux disease)    Hyperlipidemia     Patient Active Problem List   Diagnosis Date Noted   Other spondylosis with radiculopathy, cervical region 07/13/2022   Impingement syndrome of left shoulder 04/26/2022   Dyspnea 12/19/2020    History reviewed. No pertinent surgical history.  OB History     Gravida  5   Para      Term      Preterm      AB  2   Living  3      SAB  2   IAB      Ectopic      Multiple      Live Births               Home Medications    Prior to Admission medications   Medication Sig Start Date End Date Taking? Authorizing Provider  acetaminophen-codeine (TYLENOL #3) 300-30 MG tablet TAKE 1 TABLET BY MOUTH EVERY 6 HOURS AS NEEDED FOR PAIN Patient not taking: Reported on 07/13/2022 08/05/15   [provider]  albuterol   (VENTOLIN  HFA) 108 (90 Base) MCG/ACT inhaler Inhale into the lungs every 6 (six) hours as needed for wheezing or shortness of breath.    [provider]  atorvastatin (LIPITOR) 20 MG tablet Take 20 mg by mouth at bedtime. 08/05/15   [provider]  Cholecalciferol (VITAMIN D) 2000 units CAPS Take 1 capsule by mouth at bedtime.    [provider]  ciprofloxacin (CIPRO) 500 MG tablet Take 500 mg by mouth 3 (three) times daily. 09/11/20   [provider]  clobetasol cream (TEMOVATE) 0.05 % Apply 1 application topically 2 (two) times daily.    [provider]  famotidine (PEPCID) 20 MG tablet Take 20 mg by mouth 2 (two) times daily.    [provider]  gabapentin (NEURONTIN) 300 MG capsule Take 300 mg by mouth 3 (three) times daily. 07/02/20   [provider]  ibuprofen (ADVIL) 800 MG tablet Take by mouth.    [provider]  meloxicam  (MOBIC ) 15 MG tablet Take 1 tablet (15 mg total) by mouth daily. 08/13/22   Brooks, Dana, DO  omeprazole (PRILOSEC) 40 MG capsule Take 40 mg daily by mouth.    [provider]  predniSONE (DELTASONE) 20 MG tablet Take 2 tablets (40 mg total) by mouth daily with breakfast for 5 days. 12/23/23 12/28/23 Yes Leath-Warren, Etta PARAS, NP  sulfamethoxazole-trimethoprim (BACTRIM DS) 800-160 MG tablet Take 1 tablet by mouth 2 (two) times daily.    [provider]  tiZANidine (ZANAFLEX) 4 MG tablet Take 1 tablet (4 mg total) by mouth every 8 (eight) hours as needed. 12/23/23  Yes Leath-Warren, Etta PARAS, NP    Family History Family History  Problem Relation Age of Onset   Breast cancer Paternal Aunt     Social History Social History   Tobacco Use   Smoking status: Former   Smokeless tobacco: Never  Vaping Use   Vaping status: Never Used  Substance Use Topics   Alcohol use: No   Drug use: No     Allergies   Patient has no known allergies.   Review of Systems Review of  Systems Per HPI  Physical Exam Triage Vital Signs ED Triage Vitals  Encounter Vitals Group     BP 12/23/23 1709 136/79     Girls Systolic BP Percentile --      Girls Diastolic BP Percentile --      Boys Systolic BP Percentile --      Boys Diastolic BP Percentile --      Pulse Rate 12/23/23 1709 89     Resp 12/23/23 1709 20     Temp 12/23/23 1709 98.2 F (36.8 C)     Temp Source 12/23/23 1709 Oral     SpO2 12/23/23 1709 92 %     Weight --      Height --      Head Circumference --      Peak Flow --      Pain Score 12/23/23 1715 10     Pain Loc --      Pain Education --      Exclude from Growth Chart --    No data found.  Updated Vital Signs BP 136/79 (BP Location: Right Arm)   Pulse 89   Temp 98.2 F (36.8 C) (Oral)   Resp 20   SpO2 92%   Visual Acuity Right Eye Distance:   Left Eye Distance:   Bilateral Distance:    Right Eye Near:   Left Eye Near:    Bilateral Near:     Physical Exam Vitals and nursing note reviewed.  Constitutional:      General: She is not in acute distress.    Appearance: Normal appearance.  HENT:     Head: Normocephalic.  Eyes:     Extraocular Movements: Extraocular movements intact.     Pupils: Pupils are equal, round, and reactive to light.  Cardiovascular:     Rate and Rhythm: Normal rate and regular rhythm.     Pulses: Normal pulses.     Heart sounds: Normal heart sounds.  Pulmonary:     Effort: Pulmonary effort is normal. No respiratory distress.     Breath sounds: Normal breath sounds. No stridor. No wheezing, rhonchi or rales.  Abdominal:     General: Bowel sounds are normal.     Palpations: Abdomen is soft.     Tenderness: There is no abdominal tenderness.  Musculoskeletal:     Cervical back: Normal range of motion.     Lumbar back: Tenderness present. No swelling. Decreased range of motion. Negative right straight leg raise test and negative left straight leg raise test.     Right hip: Tenderness present.  No  deformity. Decreased range of motion. Normal strength.     Left hip: Tenderness present. No deformity. Decreased range of motion. Normal strength.       Legs:  Skin:    General: Skin is warm and dry.  Neurological:     General: No focal deficit present.     Mental Status: She is alert and oriented to person, place, and time.  Psychiatric:        Mood and Affect: Mood normal.        Behavior: Behavior normal.      UC Treatments / Results  Labs (all labs ordered are listed, but only abnormal results are displayed) Labs Reviewed - No data to display  EKG   Radiology No results found.  Procedures Procedures (including critical care time)  Medications Ordered in UC Medications  ketorolac (TORADOL) 30 MG/ML injection 30 mg (30 mg Intramuscular Given 12/23/23 1734)  dexamethasone (DECADRON) injection 10 mg (10 mg Intramuscular Given 12/23/23 1734)    Initial Impression / Assessment and Plan / UC Course  I have reviewed the triage vital signs and the nursing notes.  Pertinent labs & imaging results that were available during my care of the patient were reviewed by me and considered in my medical decision making (see chart for details).  On exam, patient with tenderness to the bilateral lumbar spine into the bilateral groin/hip regions.  No red flag symptoms noted on exam.  Symptoms consistent with muscle strain.  Decadron 10 mg IM and Toradol 30 mg IM administered (reviewed patient's chart, most recent creatinine was 0.73/GFR 90-8/18/2025).  Will provide symptomatic treatment with prednisone 40 mg and tizanidine 4 mg.  Supportive care recommendations were provided discussed with the patient to include over-the-counter Tylenol for breakthrough pain or discomfort, the use of ice or heat, and stretching exercises.  Discussed strict ER follow-up precautions with the patient along with indications to follow-up with her PCP.  Patient was in agreement with this plan of care and verbalizes  understanding.  All questions were answered.  Patient stable for discharge.   Final Clinical Impressions(s) / UC Diagnoses   Final diagnoses:  Lumbar pain  Bilateral hip pain     Discharge Instructions      You were given injections of Decadron 10 mg and Toradol 30 mg.  Start the prednisone tomorrow.  You may take Tylenol for breakthrough pain or discomfort. Take medication as prescribed.  While taking the prednisone, you need to take Tylenol as needed for breakthrough pain or discomfort. Recommend the use of ice or heat.  Apply ice for pain or swelling, heat for spasm or stiffness.  Apply for 20 minutes, remove for 1 hour, repeat as needed. Gentle stretching and range of motion exercises while symptoms persist. Try to remain active to help decrease your recovery time. Go to the emergency department if you experience worsening low back pain or bilateral hip pain, loss of bowel or bladder function, you become unable to walk, or other concerns. If your symptoms fail to improve with this treatment, please follow-up with your primary care physician for further evaluation. Follow-up as needed.     ED Prescriptions     Medication Sig Dispense Auth. Provider   predniSONE (DELTASONE) 20 MG tablet Take 2 tablets (40 mg total) by mouth daily with breakfast for 5 days. 10 tablet Leath-Warren, Etta PARAS, NP   tiZANidine (ZANAFLEX) 4 MG tablet Take 1 tablet (4 mg total) by mouth every 8 (eight) hours as needed.  20 tablet Leath-Warren, Etta PARAS, NP      PDMP not reviewed this encounter.   Gilmer Etta PARAS, NP 12/23/23 1739

## 2023-12-29 ENCOUNTER — Emergency Department (HOSPITAL_COMMUNITY)

## 2023-12-29 ENCOUNTER — Other Ambulatory Visit: Payer: Self-pay

## 2023-12-29 ENCOUNTER — Emergency Department (HOSPITAL_COMMUNITY)
Admission: EM | Admit: 2023-12-29 | Discharge: 2023-12-29 | Disposition: A | Discharge status: Home / Self Care | Attending: Emergency Medicine | Admitting: Emergency Medicine

## 2023-12-29 ENCOUNTER — Encounter (HOSPITAL_COMMUNITY): Payer: Self-pay

## 2023-12-29 DIAGNOSIS — Z79899 Other long term (current) drug therapy: Secondary | ICD-10-CM | POA: Diagnosis not present

## 2023-12-29 DIAGNOSIS — M545 Low back pain, unspecified: Secondary | ICD-10-CM | POA: Diagnosis present

## 2023-12-29 LAB — CBG MONITORING, ED: Glucose-Capillary: 74 mg/dL (ref 70–99)

## 2023-12-29 MED ORDER — HYDROCODONE-ACETAMINOPHEN 5-325 MG PO TABS: 1.0000 | ORAL_TABLET | Freq: Four times a day (QID) | ORAL | 0 refills | Status: AC | PRN

## 2023-12-29 MED ORDER — HYDROCODONE-ACETAMINOPHEN 5-325 MG PO TABS
1.0000 | ORAL_TABLET | Freq: Once | ORAL | Status: AC
  Administered 2023-12-29: 1 via ORAL
  Filled 2023-12-29: qty 1

## 2023-12-29 MED ORDER — LIDOCAINE 5 % EX PTCH
1.0000 | MEDICATED_PATCH | CUTANEOUS | Status: DC
  Administered 2023-12-29: 1 via TRANSDERMAL
  Filled 2023-12-29: qty 1

## 2023-12-29 MED ORDER — LIDOCAINE 5 % EX PTCH: 1.0000 | MEDICATED_PATCH | CUTANEOUS | 0 refills | Status: AC

## 2023-12-29 MED ORDER — METHYLPREDNISOLONE 4 MG PO TBPK: ORAL_TABLET | ORAL | 0 refills | Status: AC

## 2023-12-29 NOTE — Discharge Instructions (Signed)
 Please follow-up closely with your primary care doctor and with neurosurgery on an outpatient basis.  Return to emergency department immediately for any new or worsening symptoms.

## 2023-12-29 NOTE — ED Provider Notes (Signed)
 [facility] AT [facility] Provider Note   CSN: [id number] Arrival date & time: 12/29/23  9161     Patient presents with: Back Pain   Mindy Stevens is a 68 y.o. female.   Patient is a 68 year old female who presents emergency department the chief complaint of low back pain.  Patient notes that approximate 1 week ago she was attempting to get an elderly lady into a car when the lady fell and she called her.  She notes that she had to hold her for approximately 5 minutes before she could get her back up.  Patient notes that she has had no associated urinary bowel incontinence, saddle paresthesias, gait changes, fever, chills.  There is been no radiation of the pain into her legs.  She does note that the pain radiates around her flanks.  She has had no associated direct abdominal pain, nausea, vomiting, diarrhea.   Back Pain      Prior to Admission medications   Medication Sig Start Date End Date Taking? Authorizing Provider  acetaminophen-codeine (TYLENOL #3) 300-30 MG tablet TAKE 1 TABLET BY MOUTH EVERY 6 HOURS AS NEEDED FOR PAIN Patient not taking: Reported on 07/13/2022 08/05/15   [provider]  albuterol  (VENTOLIN  HFA) 108 (90 Base) MCG/ACT inhaler Inhale into the lungs every 6 (six) hours as needed for wheezing or shortness of breath.    [provider]  atorvastatin (LIPITOR) 20 MG tablet Take 20 mg by mouth at bedtime. 08/05/15   [provider]  Cholecalciferol (VITAMIN D) 2000 units CAPS Take 1 capsule by mouth at bedtime.    [provider]  ciprofloxacin (CIPRO) 500 MG tablet Take 500 mg by mouth 3 (three) times daily. 09/11/20   [provider]  clobetasol cream (TEMOVATE) 0.05 % Apply 1 application topically 2 (two) times daily.    [provider]  famotidine (PEPCID) 20 MG tablet Take 20 mg by mouth 2 (two) times daily.    [provider]  gabapentin (NEURONTIN) 300 MG capsule Take  300 mg by mouth 3 (three) times daily. 07/02/20   [provider]  ibuprofen (ADVIL) 800 MG tablet Take by mouth.    [provider]  meloxicam  (MOBIC ) 15 MG tablet Take 1 tablet (15 mg total) by mouth daily. 08/13/22   Brooks, Dana, DO  omeprazole (PRILOSEC) 40 MG capsule Take 40 mg daily by mouth.    [provider]  sulfamethoxazole-trimethoprim (BACTRIM DS) 800-160 MG tablet Take 1 tablet by mouth 2 (two) times daily.    [provider]  tiZANidine (ZANAFLEX) 4 MG tablet Take 1 tablet (4 mg total) by mouth every 8 (eight) hours as needed. 12/23/23   Leath-Warren, Etta JINNY, NP    Allergies: Patient has no known allergies.    Review of Systems  Musculoskeletal:  Positive for back pain.  All other systems reviewed and are negative.   Updated Vital Signs BP (!) 158/76 (BP Location: Right Arm)   Pulse 79   Temp 98.3 F (36.8 C) (Oral)   Resp 18   Ht 5' 2 (1.575 m)   Wt 111.1 kg   SpO2 99%   BMI 44.80 kg/m   Physical Exam Vitals and nursing note reviewed.  Constitutional:      General: She is not in acute distress.    Appearance: Normal appearance. She is not ill-appearing.  HENT:     Head: Normocephalic and atraumatic.     Nose: Nose normal.  Mouth/Throat:     Mouth: Mucous membranes are moist.  Eyes:     Extraocular Movements: Extraocular movements intact.     Conjunctiva/sclera: Conjunctivae normal.     Pupils: Pupils are equal, round, and reactive to light.  Cardiovascular:     Rate and Rhythm: Normal rate and regular rhythm.     Pulses: Normal pulses.     Heart sounds: Normal heart sounds. No murmur heard.    No gallop.  Pulmonary:     Effort: Pulmonary effort is normal. No respiratory distress.     Breath sounds: Normal breath sounds. No stridor. No wheezing, rhonchi or rales.  Abdominal:     General: Abdomen is flat. Bowel sounds are normal. There is no distension.     Palpations: Abdomen is soft.     Tenderness: There is  no abdominal tenderness. There is no guarding.  Musculoskeletal:        General: Normal range of motion.     Cervical back: Normal range of motion and neck supple.     Comments: Tender to palpation of the lumbar spine, nontender palpation of the thoracic spine, no step-off or deformity, nontender palpation over bilateral lower extremities, pelvis stable to AP lateral compression, peripheral pulses 2+  Skin:    General: Skin is warm and dry.  Neurological:     General: No focal deficit present.     Mental Status: She is alert and oriented to person, place, and time. Mental status is at baseline.     Cranial Nerves: No cranial nerve deficit.     Sensory: No sensory deficit.     Motor: No weakness.     Coordination: Coordination normal.     Gait: Gait normal.     Comments: Extension of bilateral great toes intact, extension flexions at hips intact  Psychiatric:        Mood and Affect: Mood normal.        Behavior: Behavior normal.        Thought Content: Thought content normal.        Judgment: Judgment normal.     (all labs ordered are listed, but only abnormal results are displayed) Labs Reviewed - No data to display  EKG: None  Radiology: DG Lumbar Spine 2-3 Views Result Date: 12/29/2023 CLINICAL DATA:  Low back pain. EXAM: LUMBAR SPINE - 2-3 VIEW COMPARISON:  November 25, 2021. FINDINGS: Mild superior endplate compression deformity of the L2 vertebral body appears new since the prior exam dated 11/25/2021. No evidence of osseous retropulsion. Normal alignment. No static listhesis. Interval progression of mild multilevel degenerative changes of the mid to lower lumbar spine. Sacroiliac joints and pubic symphysis are anatomically aligned. IMPRESSION: 1. Mild superior endplate compression deformity of the L2 vertebral body appears new since the prior exam dated 11/25/2021. No evidence of osseous retropulsion. 2. Interval progression of mild multilevel degenerative changes of the mid to  lower lumbar spine. Electronically Signed   By: Harrietta Sherry M.D.   On: 12/29/2023 09:42     Procedures   Medications Ordered in the ED  HYDROcodone-acetaminophen (NORCO/VICODIN) 5-325 MG per tablet 1 tablet (has no administration in time range)  lidocaine  (LIDODERM ) 5 % 1 patch (has no administration in time range)                                    Medical Decision Making Patient is doing well at this  time and is stable for discharge home.  Pain has greatly improved with treatment in the emergency department.  Patient has no concerning neurological deficits and is TUNAFISH negative.  She has no tenderness palpation of the abdomen and I do not suspect an acute intra-abdominal surgical process.  She has no indication for cauda equina syndrome, vertebral osteomyelitis, epidural abscess.  Do not suspect any further advanced imaging or MRI is warranted at this time.  Patient does have disc bulge in her lower back with no signs of acute fracture on CT scan.  Will continue symptomatic treatment outpatient basis and recommend close follow-up with primary care doctor and neurosurgery.  Strict turn precautions were provided for any new or worsening symptoms.  Patient voiced understanding and had no additional questions.  Amount and/or Complexity of Data Reviewed Radiology: ordered.  Risk Prescription drug management.        Final diagnoses:  None    ED Discharge Orders     None          Daralene Lonni JONETTA DEVONNA 12/29/23 1153    Suzette Pac, MD 12/30/23 [phone]

## 2023-12-29 NOTE — ED Triage Notes (Signed)
 Pt reports lower back pain starting a week ago and progressively getting worse. Pt states she twisted wrong and caught a family member from falling and had to hold her weight for 5 minutes. Pt reports muscle spasms.

## 2024-03-06 ENCOUNTER — Ambulatory Visit
Admission: EM | Admit: 2024-03-06 | Discharge: 2024-03-06 | Disposition: A | Discharge status: Home / Self Care | Attending: Nurse Practitioner | Admitting: Nurse Practitioner

## 2024-03-06 DIAGNOSIS — R051 Acute cough: Secondary | ICD-10-CM

## 2024-03-06 DIAGNOSIS — H6993 Unspecified Eustachian tube disorder, bilateral: Secondary | ICD-10-CM

## 2024-03-06 MED ORDER — BENZONATATE 100 MG PO CAPS: 100.0000 mg | ORAL_CAPSULE | Freq: Three times a day (TID) | ORAL | 0 refills | Status: AC | PRN

## 2024-03-06 MED ORDER — PREDNISONE 20 MG PO TABS: 20.0000 mg | ORAL_TABLET | Freq: Every day | ORAL | 0 refills | Status: AC

## 2024-03-06 NOTE — ED Provider Notes (Signed)
 " [facility]    CSN: [id number] Arrival date & time: 03/06/24  1346      History   Chief Complaint No chief complaint on file.   HPI Mindy Stevens is a 69 y.o. female.   Patient presents today with 3-day history of bilateral ear pressure/muffled hearing, worse in the right ear.  She denies ear drainage.  Also is having some sinus drainage, cough, and congestion that has not fully improved since diagnosed with pneumonia approximately 2 weeks ago.  Reports her breathing feels much better.  No recent fever, body aches or chills.  No shortness of breath, chest pain or tightness.  No abdominal pain, nausea/vomiting, or diarrhea.  No known sick contacts recently.  She reports symptoms are slowly improving but cough is still persistent.  Symptoms have not recently worsened.    Past Medical History:  Diagnosis Date   GERD (gastroesophageal reflux disease)    Hyperlipidemia     Patient Active Problem List   Diagnosis Date Noted   Other spondylosis with radiculopathy, cervical region 07/13/2022   Impingement syndrome of left shoulder 04/26/2022   Dyspnea 12/19/2020    History reviewed. No pertinent surgical history.  OB History     Gravida  5   Para      Term      Preterm      AB  2   Living  3      SAB  2   IAB      Ectopic      Multiple      Live Births               Home Medications    Prior to Admission medications  Medication Sig Start Date End Date Taking? Authorizing Provider  benzonatate  (TESSALON ) 100 MG capsule Take 1 capsule (100 mg total) by mouth 3 (three) times daily as needed for cough. Do not take with alcohol or while operating or driving heavy machinery 8/79/73  Yes Chandra Raisin A, NP  predniSONE  (DELTASONE ) 20 MG tablet Take 1 tablet (20 mg total) by mouth daily with breakfast for 5 days. 03/06/24 03/11/24 Yes Chandra Raisin LABOR, NP  acetaminophen -codeine (TYLENOL  #3) 300-30 MG tablet TAKE 1 TABLET BY MOUTH EVERY 6  HOURS AS NEEDED FOR PAIN Patient not taking: Reported on 07/13/2022 08/05/15   [provider]  albuterol  (VENTOLIN  HFA) 108 (90 Base) MCG/ACT inhaler Inhale into the lungs every 6 (six) hours as needed for wheezing or shortness of breath.    [provider]  atorvastatin (LIPITOR) 20 MG tablet Take 20 mg by mouth at bedtime. 08/05/15   [provider]  Cholecalciferol (VITAMIN D) 2000 units CAPS Take 1 capsule by mouth at bedtime.    [provider]  ciprofloxacin (CIPRO) 500 MG tablet Take 500 mg by mouth 3 (three) times daily. 09/11/20   [provider]  clobetasol cream (TEMOVATE) 0.05 % Apply 1 application topically 2 (two) times daily.    [provider]  famotidine (PEPCID) 20 MG tablet Take 20 mg by mouth 2 (two) times daily.    [provider]  gabapentin (NEURONTIN) 300 MG capsule Take 300 mg by mouth 3 (three) times daily. 07/02/20   [provider]  HYDROcodone -acetaminophen  (NORCO/VICODIN) 5-325 MG tablet Take 1 tablet by mouth every 6 (six) hours as needed for severe pain (pain score 7-10). 12/29/23   Daralene Lonni BIRCH, PA-C  ibuprofen (ADVIL) 800 MG tablet Take by mouth.  [provider]  lidocaine  (LIDODERM ) 5 % Place 1 patch onto the skin daily. Remove & Discard patch within 12 hours or as directed by MD 12/29/23   Daralene Lonni BIRCH, PA-C  meloxicam  (MOBIC ) 15 MG tablet Take 1 tablet (15 mg total) by mouth daily. 08/13/22   Burnetta Brunet, DO  methylPREDNISolone  (MEDROL  DOSEPAK) 4 MG TBPK tablet Taper over 6 days, dispense 21 tabs 12/29/23   Rigney, Lonni BIRCH, PA-C  omeprazole (PRILOSEC) 40 MG capsule Take 40 mg daily by mouth.    [provider]  sulfamethoxazole-trimethoprim (BACTRIM DS) 800-160 MG tablet Take 1 tablet by mouth 2 (two) times daily.    [provider]  tiZANidine  (ZANAFLEX ) 4 MG tablet Take 1 tablet (4 mg total) by mouth every 8 (eight) hours as needed. 12/23/23    Leath-Warren, Etta PARAS, NP    Family History Family History  Problem Relation Age of Onset   Breast cancer Paternal Aunt     Social History Social History[1]   Allergies   Patient has no known allergies.   Review of Systems Review of Systems Per HPI  Physical Exam Triage Vital Signs ED Triage Vitals  Encounter Vitals Group     BP 03/06/24 1429 129/72     Girls Systolic BP Percentile --      Girls Diastolic BP Percentile --      Boys Systolic BP Percentile --      Boys Diastolic BP Percentile --      Pulse Rate 03/06/24 1429 77     Resp 03/06/24 1429 20     Temp 03/06/24 1429 98.3 F (36.8 C)     Temp Source 03/06/24 1429 Oral     SpO2 03/06/24 1429 95 %     Weight --      Height --      Head Circumference --      Peak Flow --      Pain Score 03/06/24 1433 8     Pain Loc --      Pain Education --      Exclude from Growth Chart --    No data found.  Updated Vital Signs BP 129/72 (BP Location: Right Arm)   Pulse 77   Temp 98.3 F (36.8 C) (Oral)   Resp 20   SpO2 95%   Visual Acuity Right Eye Distance:   Left Eye Distance:   Bilateral Distance:    Right Eye Near:   Left Eye Near:    Bilateral Near:     Physical Exam Vitals and nursing note reviewed.  Constitutional:      General: She is not in acute distress.    Appearance: Normal appearance. She is not ill-appearing or toxic-appearing.  HENT:     Head: Normocephalic and atraumatic.     Right Ear: Ear canal and external ear normal. A middle ear effusion is present.     Left Ear: Ear canal and external ear normal. A middle ear effusion is present.     Nose: No congestion or rhinorrhea.     Mouth/Throat:     Mouth: Mucous membranes are moist.     Pharynx: Oropharynx is clear. No oropharyngeal exudate or posterior oropharyngeal erythema.  Eyes:     General: No scleral icterus.    Extraocular Movements: Extraocular movements intact.  Cardiovascular:     Rate and Rhythm: Normal rate and  regular rhythm.  Pulmonary:     Effort: Pulmonary effort is normal. No respiratory distress.  Breath sounds: Normal breath sounds. No wheezing, rhonchi or rales.  Musculoskeletal:     Cervical back: Normal range of motion and neck supple.  Lymphadenopathy:     Cervical: No cervical adenopathy.  Skin:    General: Skin is warm and dry.     Coloration: Skin is not jaundiced or pale.     Findings: No erythema or rash.  Neurological:     Mental Status: She is alert and oriented to person, place, and time.  Psychiatric:        Behavior: Behavior is cooperative.      UC Treatments / Results  Labs (all labs ordered are listed, but only abnormal results are displayed) Labs Reviewed - No data to display  EKG   Radiology No results found.  Procedures Procedures (including critical care time)  Medications Ordered in UC Medications - No data to display  Initial Impression / Assessment and Plan / UC Course  I have reviewed the triage vital signs and the nursing notes.  Pertinent labs & imaging results that were available during my care of the patient were reviewed by me and considered in my medical decision making (see chart for details).   Patient is a pleasant, well-appearing 69 year old female presenting today for bilateral muffled hearing and ongoing cough.  Vital signs are stable today and overall, exam is reassuring.  Viral testing deferred given length of symptoms.  Given bilateral ear effusion after pneumonia, treat with oral prednisone  20 mg daily for 5 days to help with fluid behind the ears.  Also recommended cough suppressant medication.  ER and return precautions discussed.  The patient was given the opportunity to ask questions.  All questions answered to their satisfaction.  The patient is in agreement to this plan.   Final Clinical Impressions(s) / UC Diagnoses   Final diagnoses:  Acute dysfunction of Eustachian tube, bilateral  Acute cough     Discharge  Instructions      You have a little bit of fluid behind both the ears.  Take the oral prednisone  as prescribed to treat it.  You can also use over-the-counter Flonase nasal spray to help with the fluid behind your ears.  For the cough, continue Mucinex and start Tessalon  Perles as needed.  Seek care if symptoms worsen or do not improve with treatment.    ED Prescriptions     Medication Sig Dispense Auth. Provider   predniSONE  (DELTASONE ) 20 MG tablet Take 1 tablet (20 mg total) by mouth daily with breakfast for 5 days. 5 tablet Chandra Raisin A, NP   benzonatate  (TESSALON ) 100 MG capsule Take 1 capsule (100 mg total) by mouth 3 (three) times daily as needed for cough. Do not take with alcohol or while operating or driving heavy machinery 21 capsule Chandra Raisin LABOR, NP      PDMP not reviewed this encounter.    [1]  Social History Tobacco Use   Smoking status: Former   Smokeless tobacco: Never  Vaping Use   Vaping status: Never Used  Substance Use Topics   Alcohol use: No   Drug use: No     Chandra Raisin LABOR, NP 03/06/24 1508  "

## 2024-03-06 NOTE — ED Triage Notes (Signed)
 Pt reports sinus drainage, congestin headache and ear pain x 3 days recently gotten over pneumonia,

## 2024-03-06 NOTE — Discharge Instructions (Signed)
 You have a little bit of fluid behind both the ears.  Take the oral prednisone  as prescribed to treat it.  You can also use over-the-counter Flonase nasal spray to help with the fluid behind your ears.  For the cough, continue Mucinex and start Tessalon  Perles as needed.  Seek care if symptoms worsen or do not improve with treatment.
